# Patient Record
Sex: Female | Born: 1955
Health system: Southern US, Community
[De-identification: ages and names within clinical notes are randomized; demographics above are authoritative.]

## PROBLEM LIST (undated history)

## (undated) DIAGNOSIS — Z8489 Family history of other specified conditions: Secondary | ICD-10-CM

## (undated) DIAGNOSIS — T753XXA Motion sickness, initial encounter: Secondary | ICD-10-CM

## (undated) DIAGNOSIS — D259 Leiomyoma of uterus, unspecified: Secondary | ICD-10-CM

## (undated) DIAGNOSIS — Z780 Asymptomatic menopausal state: Secondary | ICD-10-CM

## (undated) DIAGNOSIS — M7121 Synovial cyst of popliteal space [Baker], right knee: Secondary | ICD-10-CM

## (undated) DIAGNOSIS — M47816 Spondylosis without myelopathy or radiculopathy, lumbar region: Secondary | ICD-10-CM

## (undated) HISTORY — DX: Leiomyoma of uterus, unspecified: D25.9

## (undated) HISTORY — DX: Spondylosis without myelopathy or radiculopathy, lumbar region: M47.816

## (undated) HISTORY — DX: Asymptomatic menopausal state: Z78.0

---

## 2002-05-10 HISTORY — PX: ANAL SPHINCTEROTOMY: SHX1140

## 2003-05-11 HISTORY — PX: EXCISIONAL HEMORRHOIDECTOMY: SHX1541

## 2005-02-02 ENCOUNTER — Ambulatory Visit: Payer: Self-pay | Admitting: Unknown Physician Specialty

## 2006-08-25 ENCOUNTER — Ambulatory Visit: Payer: Self-pay | Admitting: Unknown Physician Specialty

## 2006-08-29 ENCOUNTER — Ambulatory Visit: Payer: Self-pay | Admitting: Unknown Physician Specialty

## 2006-08-31 ENCOUNTER — Ambulatory Visit: Payer: Self-pay | Admitting: Unknown Physician Specialty

## 2007-08-10 ENCOUNTER — Ambulatory Visit: Payer: Self-pay | Admitting: Unknown Physician Specialty

## 2008-05-16 ENCOUNTER — Ambulatory Visit: Payer: Self-pay | Admitting: Unknown Physician Specialty

## 2008-05-23 ENCOUNTER — Ambulatory Visit: Payer: Self-pay | Admitting: Unknown Physician Specialty

## 2008-05-24 ENCOUNTER — Ambulatory Visit: Payer: Self-pay | Admitting: Unknown Physician Specialty

## 2008-12-16 ENCOUNTER — Encounter: Payer: Self-pay | Admitting: Cardiology

## 2008-12-31 ENCOUNTER — Telehealth (INDEPENDENT_AMBULATORY_CARE_PROVIDER_SITE_OTHER): Payer: Self-pay

## 2008-12-31 DIAGNOSIS — R0609 Other forms of dyspnea: Secondary | ICD-10-CM | POA: Insufficient documentation

## 2008-12-31 DIAGNOSIS — R0989 Other specified symptoms and signs involving the circulatory and respiratory systems: Secondary | ICD-10-CM

## 2008-12-31 DIAGNOSIS — R0789 Other chest pain: Secondary | ICD-10-CM | POA: Insufficient documentation

## 2009-01-01 ENCOUNTER — Ambulatory Visit: Payer: Self-pay

## 2009-01-01 ENCOUNTER — Encounter: Payer: Self-pay | Admitting: Cardiology

## 2009-01-01 ENCOUNTER — Encounter: Payer: Self-pay | Admitting: Cardiovascular Disease

## 2009-01-28 ENCOUNTER — Ambulatory Visit: Payer: Self-pay | Admitting: Cardiology

## 2009-01-28 DIAGNOSIS — I951 Orthostatic hypotension: Secondary | ICD-10-CM | POA: Insufficient documentation

## 2009-01-28 DIAGNOSIS — R0602 Shortness of breath: Secondary | ICD-10-CM | POA: Insufficient documentation

## 2009-01-28 DIAGNOSIS — R079 Chest pain, unspecified: Secondary | ICD-10-CM | POA: Insufficient documentation

## 2009-03-12 ENCOUNTER — Ambulatory Visit: Payer: Self-pay | Admitting: Unknown Physician Specialty

## 2009-03-17 LAB — HM COLONOSCOPY: HM Colonoscopy: NORMAL

## 2010-04-06 ENCOUNTER — Ambulatory Visit: Payer: Self-pay | Admitting: Unknown Physician Specialty

## 2010-06-09 NOTE — Progress Notes (Signed)
Summary: PHI  PHI   Imported By: Harlon Flor 01/28/2009 15:08:51  _____________________________________________________________________  External Attachment:    Type:   Image     Comment:   External Document

## 2010-06-09 NOTE — Progress Notes (Signed)
Summary: Office Visit-[facility]  Office Visit-[facility]   Imported By: Harlon Flor 12/19/2008 07:59:06  _____________________________________________________________________  External Attachment:    Type:   Image     Comment:   External Document  Appended Document: Office Visit-[facility] Set up Echo and Exer Myoview in [place] office then I will call her.  Reviewed Juanito Doom, MD  Appended Document: Orders Update    Clinical Lists Changes  Problems: Added new problem of CHEST PAIN, ATYPICAL (ICD-786.59) Added new problem of DYSPNEA ON EXERTION (ICD-786.09) Orders: Added new Referral order of Nuclear Stress Test (Nuc Stress Test) - Signed Added new Referral order of Echocardiogram (Echo) - Signed

## 2010-06-09 NOTE — Assessment & Plan Note (Signed)
Summary: CRS/DX:/WT:115/INS:?/DR [facility]  Nuclear Med Background Indications for Stress Test: Evaluation for Ischemia     Symptoms: Chest Pain, Palpitations, SOB    Nuclear Pre-Procedure Cardiac Risk Factors: Family History - CAD Caffeine/Decaff Intake: yes NPO After: 7:30 AM Lungs: clear IV 0.9% NS with Angio Cath: 24g     IV Site: (L)  Hand IV Started by: Burna Mortimer Deal RT-N Chest Size (in) 34     Cup Size D     Height (in): 61 Weight (lb): 115 BMI: 21.81  Nuclear Med Study 1 or 2 day study:  1 day     Stress Test Type:  Stress Reading MD:  Charlton Haws, MD     Referring MD:  T.Wall Resting Radionuclide:  Technetium 39m Tetrofosmin     Resting Radionuclide Dose:  10.2 mCi  Stress Radionuclide:  Technetium 82m Tetrofosmin     Stress Radionuclide Dose:  33 mCi   Stress Protocol Exercise Time (min):  10:30 min     Max HR:  157 bpm     Predicted Max HR: 167 bpm  Max Systolic BP: 160 mm Hg     % Max HR:  94 %     METS: 12.5 Rate Pressure Product:  16109    Stress Test Technologist:  Milana Na EMT-P     Nuclear Technologist:  Burna Mortimer Deal RT-N  Rest Procedure  Myocardial perfusion imaging was performed at rest 45 minutes following the intraveneous administration of Myoview Technetium 91m Tetrofosmin.  Stress Procedure  The patient exercised for 10:30.The patient stopped due to fatigue and denied any chest pain.  There were no significant ST-T wave changes.  Myoview was injected at peak exercise and myocardial perfusion imaging was performed after a brief delay.  QPS Raw Data Images:  Normal; no motion artifact; normal heart/lung ratio. Stress Images:  NI: Uniform and normal uptake of tracer in all myocardial segments. Rest Images:  Normal homogeneous uptake in all areas of the myocardium. Subtraction (SDS):  Normal Transient Ischemic Dilatation:  .89  (Normal <1.22)  Lung/Heart Ratio:  .3  (Normal <0.45)  Quantitative Gated Spect Images QGS EDV:  63 ml QGS ESV:   18 ml QGS EF:  72 % QGS cine images:  Normal  Findings Normal nuclear study      Overall Impression  Exercise Capacity: Good exercise capacity. BP Response: Normal blood pressure response. Clinical Symptoms: No chest pain ECG Impression: No significant ST segment change suggestive of ischemia. Overall Impression: Normal stress nuclear study. Overall Impression Comments: Normal  Appended Document: CRS/DX:/WT:115/INS:?/DR WALL/AMANDA PT AWARE OF RESULT KIMBERLY :)  Appended Document: CRS/DX:/WT:115/INS:?/DR WALL/AMANDA please call pt with good news.  Reviewed Juanito Doom, MD \

## 2010-06-09 NOTE — Assessment & Plan Note (Signed)
Summary: NP6/AMD   Visit Type:  Initial Consult  CC:  intermittent L chest warmness/ occiasional pain.  Preventive Screening-Counseling & Management  Alcohol-Tobacco     Alcohol drinks/day: 1     Alcohol type: wine     Smoking Status: never  Caffeine-Diet-Exercise     Caffeine use/day: 1 -2 cups a day     Does Patient Exercise: yes     Type of exercise: bow hunts      Drug Use:  no.    Current Medications (verified): 1)  None  Allergies (verified): 1)  ! Codeine  Past History:  Past Medical History: asthma constipation uterine fibroids degenerative arthritis of the lumbar spine  Past Surgical History: none  Family History: significant for breast cancer in her grandmother and breast cancer in maternal aunt and some heart disease in the men on her family.  Social History: Full Time Single  Tobacco Use - No.  Alcohol Use - yes Regular Exercise - yes Drug Use - no Alcohol drinks/day:  1 Does Patient Exercise:  yes Smoking Status:  never Caffeine use/day:  1 -2 cups a day Drug Use:  no  Vital Signs:  Patient profile:   55 year old female Height:      61 inches Weight:      111.50 pounds Pulse rate:   66 / minute BP sitting:   120 / 68  (left arm)  Vitals Entered By: Charlena Cross, RN, BSN (January 28, 2009 11:22 AM)   Problems:  Medical Problems Added: 1)  Dx of Dyspnea  (ICD-786.05) 2)  Dx of Orthostatic Hypotension  (ICD-458.0) 3)  Dx of Chest Pain-unspecified  (ICD-786.50)  Echocardiogram  Procedure date:  01/01/2009  Findings:      Left ventricle: The cavity size was normal. Rykker Coviello thickness was     normal. Systolic function was normal. The estimated ejection     fraction was in the range of 55% to 60%. Jeanean Hollett motion was normal;     there were no regional Aariah Godette motion abnormalities. Left ventricular     diastolic function parameters were normal.    Other Orders: EKG w/ Interpretation (93000)  Patient Instructions: 1)  Your  physician recommends that you schedule a follow-up appointment as needed.

## 2010-06-09 NOTE — Progress Notes (Signed)
Summary: [facility]   Imported By: Harlon Flor 01/28/2009 15:08:36  _____________________________________________________________________  External Attachment:    Type:   Image     Comment:   External Document

## 2010-06-09 NOTE — Progress Notes (Signed)
Summary: Nuc. Pre-procedure  Phone Note Outgoing Call Call back at [facility] Phone [phone]   Call placed by: Irean Hong, RN,  December 31, 2008 11:52 AM Summary of Call: Left message with information on Myoview Information Sheet (see scanned document for details).      Nuclear Med Background Indications for Stress Test: Evaluation for Ischemia     Symptoms: Chest Pain, SOB    Nuclear Pre-Procedure Cardiac Risk Factors: Family History - CAD

## 2010-09-22 NOTE — Assessment & Plan Note (Signed)
[facility] OFFICE NOTE   Kristi Coleman, Kristi Coleman                          MRN:          [id number]  DATE:01/28/2009                            DOB:          [birth year]    Kristi Coleman comes in today because of some atypical chest pain that she has  been having off and on for a year.  She is a 55 year old single white  female, friend of mine, who sees Dr. Lin Coleman on a annual basis.  On her  last visit, she recommended a stress test and this was arranged through  my office.   She had a nuclear stress study on January 01, 2009.  She exercised for a  total of 10-1/2 minutes, MET level achieved was 12.5, peak heart rate  was 157 which is 94% of her predicted maximum heart rate, maximum  systolic blood pressure was 160.   There were no significant ST-segment changes.  Her wall motion and  function were normal.  EF was 72% with no ischemia.  She had no chest  pain.   This discomfort is described as a ember that glows on and off under  her chest wall just above her left breast.  It does not radiate.  It is  not associated with exertion.  It can happen several times a day.  She  denies any GERD symptoms or difficulty swallowing.   She is very active hunting and farming.  She has no symptoms whatsoever.  No limitations.   PAST MEDICAL HISTORY:  Significant for asthma, history of constipation,  uterine fibroids, degenerative arthritis of the lumbar spine.   MEDICATIONS:  She is currently on no medications.   ALLERGIES:  She is allergic to CODEINE.   SOCIAL HISTORY:  Single.  She is in the [employer] and also  [employer], and land and Land.  She drinks 1-2  cups of caffeinated beverage a day.  She stays very active.  She  occasionally will have a glass of wine.  She has never smoked.   FAMILY HISTORY:  Negative for premature coronary disease.   REVIEW OF SYSTEMS:  Totally negative other than  HPI.   PHYSICAL EXAMINATION:  VITAL SIGNS:  Her blood pressure is 120/68 in the  left arm, her pulse is 66 and regular, height 61 inches, her weight is  111.5 pounds.  GENERAL:  She is in no acute distress.  HEENT:  Normal.  NECK:  Carotid upstrokes were equal bilaterally without bruits.  No JVD.  Thyroid is not enlarged.  Trachea is midline.  CHEST/LUNGS:  Clear to  auscultation and percussion.  HEART:  Nondisplaced PMI.  Normal S1 and S2.  No gallop.  ABDOMEN:  Soft, good bowel sounds.  No midline bruits.  EXTREMITIES:  No cyanosis, clubbing, or edema.  Pulses are intact.   EKG shows normal sinus rhythm with a suggestion of RSR prime in V1 and  V2 without any criteria.  Overall, normal EKG.   ASSESSMENT AND PLAN:  1. Chronic atypical chest pain, unknown etiology.  There is no  evidence of organic heart disease or coronary disease.  2. Minimal risk factors for vascular disease going forward.  These      were reviewed with the patient.  3. Good physical conditioning.   RECOMMENDATIONS:  None.  We will plan on seeing her back p.r.n.     Thomas C. Daleen Squibb, MD, [facility]  Electronically Signed    TCW/MedQ  DD: 01/28/2009  DT: 01/29/2009  Job #: [id number]   cc:   Francia Greaves, MD

## 2010-11-24 ENCOUNTER — Encounter: Payer: Self-pay | Admitting: Cardiology

## 2011-03-24 ENCOUNTER — Ambulatory Visit: Payer: Self-pay | Admitting: Internal Medicine

## 2011-04-17 LAB — HM MAMMOGRAPHY: HM Mammogram: NORMAL

## 2011-04-28 ENCOUNTER — Encounter: Payer: Self-pay | Admitting: Internal Medicine

## 2011-04-28 ENCOUNTER — Ambulatory Visit: Payer: Self-pay | Admitting: Unknown Physician Specialty

## 2011-04-28 ENCOUNTER — Ambulatory Visit (INDEPENDENT_AMBULATORY_CARE_PROVIDER_SITE_OTHER): Payer: 59 | Admitting: Internal Medicine

## 2011-04-28 VITALS — BP 120/70 | HR 81 | Temp 98.1°F | Ht 61.25 in | Wt 112.8 lb

## 2011-04-28 DIAGNOSIS — R432 Parageusia: Secondary | ICD-10-CM

## 2011-04-28 DIAGNOSIS — Z780 Asymptomatic menopausal state: Secondary | ICD-10-CM | POA: Insufficient documentation

## 2011-04-28 DIAGNOSIS — R439 Unspecified disturbances of smell and taste: Secondary | ICD-10-CM

## 2011-04-28 NOTE — Patient Instructions (Signed)
Try OTC Prilosec ( omeprazole)  20 mg daily for a minimum of 7 days  the next time you get  The metallic taste, to see if the cause is reflux.Marland Kitchen

## 2011-05-02 ENCOUNTER — Encounter: Payer: Self-pay | Admitting: Internal Medicine

## 2011-05-02 DIAGNOSIS — R432 Parageusia: Secondary | ICD-10-CM | POA: Insufficient documentation

## 2011-05-02 MED ORDER — OMEPRAZOLE 20 MG PO CPDR: 20.0000 mg | DELAYED_RELEASE_CAPSULE | Freq: Every day | ORAL | Status: DC

## 2011-05-02 NOTE — Progress Notes (Signed)
  Subjective:    Patient ID: Kristi Coleman, female    DOB: [birth year], 55 y.o.   MRN: [id number]  HPI  Kristi Coleman is a 55 yr odl postmenopausal female who presents to establish primary care, transferring from Kristi Coleman who is leaving the area. She has a chief complaint of a metallic taste in her mouth.  She states that she has the symptom several times per day for the last 3 months or longer.  She denies any recent upper respiratory infection, and has no history of allergic rhinitis.  She has occasional reflux but not on a daily basis.  She has no symptoms of altered taste, no headaches, sinus pain .  She deos not smoke or drink alcohol to excess,  No use of illicit drugs.  No history of iron deficiency or PICA.  Past Medical History  Diagnosis Date  . Asthma   . Constipation   . Uterine fibroid   . Degenerative arthritis of lumbar spine   . Menopause      No current outpatient prescriptions on file prior to visit.   Review of Systems  Constitutional: Negative for fever, chills and unexpected weight change.  HENT: Negative for hearing loss, ear pain, nosebleeds, congestion, sore throat, facial swelling, rhinorrhea, sneezing, mouth sores, trouble swallowing, neck pain, neck stiffness, voice change, postnasal drip, sinus pressure, tinnitus and ear discharge.   Eyes: Negative for pain, discharge, redness and visual disturbance.  Respiratory: Negative for cough, chest tightness, shortness of breath, wheezing and stridor.   Cardiovascular: Negative for chest pain, palpitations and leg swelling.  Musculoskeletal: Negative for myalgias and arthralgias.  Skin: Negative for color change and rash.  Neurological: Negative for dizziness, weakness, light-headedness and headaches.  Hematological: Negative for adenopathy.           Objective:   Physical Exam  Constitutional: She is oriented to person, place, and time. She appears well-developed and well-nourished.  HENT:  Mouth/Throat:  Oropharynx is clear and moist.  Eyes: EOM are normal. Pupils are equal, round, and reactive to light. No scleral icterus.  Neck: Normal range of motion. Neck supple. No JVD present. No thyromegaly present.  Cardiovascular: Normal rate, regular rhythm, normal heart sounds and intact distal pulses.   Pulmonary/Chest: Effort normal and breath sounds normal.  Abdominal: Soft. Bowel sounds are normal. She exhibits no mass. There is no tenderness.  Musculoskeletal: Normal range of motion. She exhibits no edema.  Lymphadenopathy:    She has no cervical adenopathy.  Neurological: She is alert and oriented to person, place, and time.  Skin: Skin is warm and dry.  Psychiatric: She has a normal mood and affect.          Assessment & Plan:

## 2011-05-02 NOTE — Assessment & Plan Note (Signed)
She has no history of exposure to the drugs commonly associated with causing metallic taste in mouth.  Will treat her empirically for reflux and allergic rhinitis while waiting for recent labs from [facility] office. Will need to rule out sinus disease with ENT evlauation and check serologies for Sjogren's, liver and kidney dysfunction if symptoms doe not resolved with empiric treatment of above.

## 2011-07-08 ENCOUNTER — Encounter: Payer: Self-pay | Admitting: Internal Medicine

## 2012-01-16 LAB — HM PAP SMEAR: HM Pap smear: NORMAL

## 2012-01-16 LAB — HM MAMMOGRAPHY: HM Mammogram: NORMAL

## 2012-02-16 DIAGNOSIS — M7710 Lateral epicondylitis, unspecified elbow: Secondary | ICD-10-CM | POA: Insufficient documentation

## 2012-03-15 ENCOUNTER — Telehealth: Payer: Self-pay | Admitting: Internal Medicine

## 2012-03-15 NOTE — Telephone Encounter (Signed)
Patient calling, has left arm pain to the elbow.  Has tingling, numbness and a "hot" sensation that awakens her at night.  During the day will have a tingling sensation intermittently during the day.   Has been takin Aleve with no relief of the sx.   Triaged per Arm Non-Injury.  Needs to be seen in  24 hours.  No appts available for today.  Scheduled for 1115 on Thursday with Dr. Darrick Huntsman.

## 2012-03-16 ENCOUNTER — Encounter: Payer: Self-pay | Admitting: Internal Medicine

## 2012-03-16 ENCOUNTER — Ambulatory Visit (INDEPENDENT_AMBULATORY_CARE_PROVIDER_SITE_OTHER): Payer: 59 | Admitting: Internal Medicine

## 2012-03-16 VITALS — BP 118/70 | HR 64 | Temp 98.2°F | Ht 61.5 in | Wt 112.2 lb

## 2012-03-16 DIAGNOSIS — G589 Mononeuropathy, unspecified: Secondary | ICD-10-CM

## 2012-03-16 DIAGNOSIS — G629 Polyneuropathy, unspecified: Secondary | ICD-10-CM

## 2012-03-16 MED ORDER — MELOXICAM 15 MG PO TABS: 15.0000 mg | ORAL_TABLET | Freq: Every day | ORAL | Status: DC

## 2012-03-16 NOTE — Progress Notes (Signed)
Patient ID: Kristi Coleman, female   DOB: November 20, 1955, 56 y.o.   MRN: [id number]  Patient Active Problem List  Diagnosis  . Dysgeusia  . Neuropathy    Subjective:  CC:   Chief Complaint  Patient presents with  . Arm Pain    HPI:   Kristi Coleman a 56 y.o. female who presents Followup on acute and chronic issues. She said Dr. Mahlon Gammon last week for her annual Pap smear. She was diagnosed with low vitamin D and a prescription was given to her. 2) a worsening numbness, left hand. She has a 6 to 8 month history of left forearm having occasional numbness. 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 She noted some point tenderness on the lateral aspect of her left elbow about a month ago and has stopped all physical labor involving use of the left arm. She thinks that the pain was aggravated by wasting 50 pound bags of feed above her waist level. However despite modification of her physical activity she has developed numbness in the left forearm and hand which has become persistent and daily. Numbness more pronounced in the morning.  She was evaluated casually by a visit physician's assistant at [facility] recently who obtained an x-ray of the elbow which showed no signs of arthritis. She has been taking Aleve daily for a week with no consideration will improvement in the pain. She denies any loss of strength or fine motor skills. No history of neck pain or shoulder pain. A   Past Medical History  Diagnosis Date  . Asthma   . Constipation   . Uterine fibroid   . Degenerative arthritis of lumbar spine   . Menopause     Past Surgical History  Procedure Date  . Excisional hemorrhoidectomy 2005    [facility]         The following portions of the patient's history were reviewed and updated as appropriate: Allergies, current medications, and problem list.    Review of Systems:   12 Pt  review of systems was negative except those addressed in the  HPI,     History   Social History  . Marital Status: Married    Spouse Name: N/A    Number of Children: N/A  . Years of Education: N/A   Occupational History  . Full time    Social History Main Topics  . Smoking status: Never Smoker   . Smokeless tobacco: Not on file     Comment: Tobacco use-no  . Alcohol Use: 2.5 oz/week    5 drink(s) per week  . Drug Use: No  . Sexually Active: Not Currently   Other Topics Concern  . Not on file   Social History Narrative   Single.Pt gets regular exercise.    Objective:  BP 118/70  Pulse 64  Temp 98.2 F (36.8 C) (Oral)  Ht 5' 1.5" (1.562 m)  Wt 112 lb 4 oz (50.916 kg)  BMI 20.87 kg/m2  SpO2 98%  General appearance: alert, cooperative and appears stated age  Neck: no adenopathy, no carotid bruit, supple, symmetrical, trachea midline and thyroid not enlarged, symmetric, no tenderness/mass/nodules Back: symmetric, no curvature. ROM normal. No CVA tenderness. Lungs: clear to auscultation bilaterally Heart: regular rate and rhythm, S1, S2 normal, no murmur, click, rub or gallop Abdomen: soft, non-tender; bowel sounds normal; no masses,  no organomegaly Pulses: 2+ and symmetric including left radial wrist  Skin: Skin color, texture, turgor normal.  No rashes or lesions Lymph nodes: Cervical, supraclavicular, and axillary nodes normal. Neuro: Left arm DTRs normal.,  Strength normal at elbow and wrist. No loss of thenar muscle tone.  Positive Phalen's sign.   Assessment and Plan:  Neuropathy A symmetric, peripheral. Unclear whether her distribution of symptoms points to median or ulnar neuropathy or combined neuropathy. Recommended trial of meloxicam continued abstinence from physical activities and EMG nerve conduction studies.   Updated Medication List Outpatient Encounter Prescriptions as of 03/16/2012  Medication Sig Dispense Refill  . meloxicam (MOBIC) 15 MG tablet Take 1 tablet (15 mg total) by mouth daily.  30 tablet  2   . omeprazole (PRILOSEC) 20 MG capsule Take 1 capsule (20 mg total) by mouth daily.  30 capsule  3     Orders Placed This Encounter  Procedures  . Heavy metals screen, urine  . NCV with EMG(electromyography)    No Follow-up on file.

## 2012-03-16 NOTE — Patient Instructions (Signed)
Take the meloxicam once daily with breakfast.  Ok to add 500 mg tylenol every 6 hours if needed ,  Do not take alleve or ibuprofen with meloxicam  EMG /nerve conduction studies of forearm to determine if this is all median (carpal tunnel) or ulnar nerve driven.    24 hr urine collection for heavy metals.

## 2012-03-17 ENCOUNTER — Encounter: Payer: Self-pay | Admitting: Internal Medicine

## 2012-03-17 DIAGNOSIS — G629 Polyneuropathy, unspecified: Secondary | ICD-10-CM | POA: Insufficient documentation

## 2012-03-17 NOTE — Assessment & Plan Note (Signed)
A symmetric, peripheral. Unclear whether her distribution of symptoms points to median or ulnar neuropathy or combined neuropathy. Recommended trial of meloxicam continued abstinence from physical activities and EMG nerve conduction studies.

## 2012-05-22 ENCOUNTER — Other Ambulatory Visit (INDEPENDENT_AMBULATORY_CARE_PROVIDER_SITE_OTHER): Payer: 59

## 2012-05-22 DIAGNOSIS — G589 Mononeuropathy, unspecified: Secondary | ICD-10-CM

## 2012-05-30 LAB — HEAVY METALS SCREEN, URINE
Arsenic, 24H Ur: 12 mcg/L (ref <81)
Lead, Urine (24 Hr): <1 mcg/L (ref <80)
Mercury 24 Hr Urine: <2 mcg/L (ref <21)

## 2012-05-31 ENCOUNTER — Ambulatory Visit: Payer: Self-pay | Admitting: Internal Medicine

## 2012-06-12 ENCOUNTER — Encounter: Payer: Self-pay | Admitting: Internal Medicine

## 2013-06-01 ENCOUNTER — Ambulatory Visit: Payer: Self-pay | Admitting: Internal Medicine

## 2013-06-01 LAB — HM MAMMOGRAPHY: HM Mammogram: NEGATIVE

## 2013-06-05 ENCOUNTER — Encounter: Payer: Self-pay | Admitting: Internal Medicine

## 2013-10-17 ENCOUNTER — Ambulatory Visit (INDEPENDENT_AMBULATORY_CARE_PROVIDER_SITE_OTHER): Payer: BC Managed Care – PPO | Admitting: Internal Medicine

## 2013-10-17 ENCOUNTER — Encounter: Payer: Self-pay | Admitting: *Deleted

## 2013-10-17 ENCOUNTER — Encounter: Payer: Self-pay | Admitting: Internal Medicine

## 2013-10-17 VITALS — BP 106/60 | HR 58 | Temp 97.7°F | Resp 14 | Ht 61.0 in | Wt 114.2 lb

## 2013-10-17 DIAGNOSIS — Z800 Family history of malignant neoplasm of digestive organs: Secondary | ICD-10-CM

## 2013-10-17 DIAGNOSIS — Z13220 Encounter for screening for lipoid disorders: Secondary | ICD-10-CM

## 2013-10-17 DIAGNOSIS — R632 Polyphagia: Secondary | ICD-10-CM

## 2013-10-17 DIAGNOSIS — Z8719 Personal history of other diseases of the digestive system: Secondary | ICD-10-CM

## 2013-10-17 NOTE — Progress Notes (Signed)
Pre-visit discussion using our clinic review tool. No additional management support is needed unless otherwise documented below in the visit note.  

## 2013-10-17 NOTE — Patient Instructions (Signed)
Your recurrent aphthous ulcers may be a signs of a food allergy or a vitamin deficiency  When you return for fasting labs I will investigate this possibility

## 2013-10-18 ENCOUNTER — Other Ambulatory Visit (INDEPENDENT_AMBULATORY_CARE_PROVIDER_SITE_OTHER): Payer: BC Managed Care – PPO

## 2013-10-18 DIAGNOSIS — Z13220 Encounter for screening for lipoid disorders: Secondary | ICD-10-CM

## 2013-10-18 DIAGNOSIS — K120 Recurrent oral aphthae: Secondary | ICD-10-CM | POA: Insufficient documentation

## 2013-10-18 DIAGNOSIS — R632 Polyphagia: Secondary | ICD-10-CM

## 2013-10-18 DIAGNOSIS — Z8719 Personal history of other diseases of the digestive system: Secondary | ICD-10-CM

## 2013-10-18 LAB — CBC WITH DIFFERENTIAL/PLATELET
Basophils Absolute: 0 10*3/uL (ref 0.0–0.1)
Basophils Relative: 0.6 % (ref 0.0–3.0)
Eosinophils Absolute: 0.1 10*3/uL (ref 0.0–0.7)
Eosinophils Relative: 2.5 % (ref 0.0–5.0)
HCT: 35.3 % — ABNORMAL LOW (ref 36.0–46.0)
Hemoglobin: 12 g/dL (ref 12.0–15.0)
Lymphocytes Relative: 32.2 % (ref 12.0–46.0)
Lymphs Abs: 1.6 10*3/uL (ref 0.7–4.0)
MCHC: 34 g/dL (ref 30.0–36.0)
MCV: 91.8 fl (ref 78.0–100.0)
Monocytes Absolute: 0.4 10*3/uL (ref 0.1–1.0)
Monocytes Relative: 8.2 % (ref 3.0–12.0)
Neutro Abs: 2.8 10*3/uL (ref 1.4–7.7)
Neutrophils Relative %: 56.5 % (ref 43.0–77.0)
Platelets: 179 10*3/uL (ref 150.0–400.0)
RBC: 3.85 Mil/uL — ABNORMAL LOW (ref 3.87–5.11)
RDW: 13.3 % (ref 11.5–15.5)
WBC: 4.9 10*3/uL (ref 4.0–10.5)

## 2013-10-18 LAB — SEDIMENTATION RATE: Sed Rate: 19 mm/hr (ref 0–22)

## 2013-10-18 LAB — COMPREHENSIVE METABOLIC PANEL
ALT: 18 U/L (ref 0–35)
AST: 19 U/L (ref 0–37)
Albumin: 4.1 g/dL (ref 3.5–5.2)
Alkaline Phosphatase: 55 U/L (ref 39–117)
BUN: 20 mg/dL (ref 6–23)
CO2: 30 mEq/L (ref 19–32)
Calcium: 9.6 mg/dL (ref 8.4–10.5)
Chloride: 102 mEq/L (ref 96–112)
Creatinine, Ser: 0.6 mg/dL (ref 0.4–1.2)
GFR: 113.46 mL/min (ref >60.00)
Glucose, Bld: 95 mg/dL (ref 70–99)
Potassium: 4.6 mEq/L (ref 3.5–5.1)
Sodium: 138 mEq/L (ref 135–145)
Total Bilirubin: 0.6 mg/dL (ref 0.2–1.2)
Total Protein: 6.6 g/dL (ref 6.0–8.3)

## 2013-10-18 LAB — LIPID PANEL
Cholesterol: 205 mg/dL — ABNORMAL HIGH (ref 0–200)
HDL: 86.9 mg/dL (ref >39.00)
LDL Cholesterol: 109 mg/dL — ABNORMAL HIGH (ref 0–99)
NonHDL: 118.1
Total CHOL/HDL Ratio: 2
Triglycerides: 44 mg/dL (ref 0.0–149.0)
VLDL: 8.8 mg/dL (ref 0.0–40.0)

## 2013-10-18 LAB — VITAMIN B12: Vitamin B-12: 513 pg/mL (ref 211–911)

## 2013-10-18 LAB — T4, FREE: Free T4: 0.75 ng/dL (ref 0.60–1.60)

## 2013-10-18 LAB — TSH: TSH: 2.41 u[IU]/mL (ref 0.35–4.50)

## 2013-10-18 MED ORDER — TRIAMCINOLONE ACETONIDE 0.1 % MT PSTE: 1.0000 "application " | PASTE | Freq: Two times a day (BID) | OROMUCOSAL | Status: DC

## 2013-10-18 NOTE — Assessment & Plan Note (Addendum)
She has no signs or symptoms of celiac disease  Or SLE but will  check serologies for nutritional deficiencies. Referral to GI for colonoscopy and endoscopy to rule out celiac disease

## 2013-10-18 NOTE — Progress Notes (Signed)
Patient ID: Kristi Coleman, female   DOB: [birth year], 58 y.o.   MRN: [id number]  Patient Active Problem List   Diagnosis Date Noted  . Recurrent aphthous stomatitis 10/18/2013    Subjective:  CC:   Chief Complaint  Patient presents with  . Headache    X 2  weeks non stop have now stopped. mouth ulcers weekly.    HPI:   Kristi Coleman is a 58 y.o. female who presents for Recurrent aphthous ulcers.  She reports painful mouth ulcers occurring nearly weekly.  Has tried avoiding certain foods with some improvement. o history fo skin ulcers, arthritis,  Or Genital ulcers. Prior HSV serologies were negative. Not sexually active.  Has had prior evaluations by ENT including allergy testing. Used silver nitrate in the past ,  By prior PCP, but procedyure was painful .Also has occasional swelling of upper eyelids noted in there morning ,    Past Medical History  Diagnosis Date  . Asthma   . Constipation   . Uterine fibroid   . Degenerative arthritis of lumbar spine   . Menopause     Past Surgical History  Procedure Laterality Date  . Excisional hemorrhoidectomy  2005    [facility]  . Anal sphincterotomy  2004    [facility]       The following portions of the patient's history were reviewed and updated as appropriate: Allergies, current medications, and problem list.    Review of Systems:   Patient denies headache, fevers, malaise, unintentional weight loss, skin rash, eye pain, sinus congestion and sinus pain, sore throat, dysphagia,  hemoptysis , cough, dyspnea, wheezing, chest pain, palpitations, orthopnea, edema, abdominal pain, nausea, melena, diarrhea, constipation, flank pain, dysuria, hematuria, urinary  Frequency, nocturia, numbness, tingling, seizures,  Focal weakness, Loss of consciousness,  Tremor, insomnia, depression, anxiety, and suicidal ideation.     History   Social History  . Marital Status: Married    Spouse Name: N/A    Number of Children: N/A  . Years of  Education: N/A   Occupational History  . Full time    Social History Main Topics  . Smoking status: Never Smoker   . Smokeless tobacco: Not on file     Comment: Tobacco use-no  . Alcohol Use: 2.5 oz/week    5 drink(s) per week  . Drug Use: No  . Sexual Activity: Not Currently   Other Topics Concern  . Not on file   Social History Narrative   Single.   Pt gets regular exercise.    Objective:  Filed Vitals:   10/17/13 1117  BP: 106/60  Pulse: 58  Temp: 97.7 F (36.5 C)  Resp: 14     General appearance: alert, cooperative and appears stated age Ears: normal TM's and external ear canals both ears Throat: lips, mucosa, and tongue normal; teeth and gums normal Neck: no adenopathy, no carotid bruit, supple, symmetrical, trachea midline and thyroid not enlarged, symmetric, no tenderness/mass/nodules Back: symmetric, no curvature. ROM normal. No CVA tenderness. Lungs: clear to auscultation bilaterally Heart: regular rate and rhythm, S1, S2 normal, no murmur, click, rub or gallop Abdomen: soft, non-tender; bowel sounds normal; no masses,  no organomegaly Pulses: 2+ and symmetric Skin: Skin color, texture, turgor normal. No rashes or lesions Lymph nodes: Cervical, supraclavicular, and axillary nodes normal.  Assessment and Plan:  Recurrent aphthous stomatitis She has no signs or symptoms of celiac disease  Or SLE but will  check serologies for nutritional deficiencies. Referral to GI for colonoscopy  and endoscopy to rule out celiac disease   Updated Medication List Outpatient Encounter Prescriptions as of 10/17/2013  Medication Sig  . meloxicam (MOBIC) 15 MG tablet Take 1 tablet (15 mg total) by mouth daily.  Marland Kitchen omeprazole (PRILOSEC) 20 MG capsule Take 1 capsule (20 mg total) by mouth daily.     Orders Placed This Encounter  Procedures  . CBC with Differential  . Comprehensive metabolic panel  . TSH  . Lipid panel  . T4, free  . Vitamin B1  . Vitamin B12  .  Zinc  . Sedimentation rate  . Ambulatory referral to Gastroenterology    No Follow-up on file.

## 2013-10-22 LAB — VITAMIN B1: Vitamin B1 (Thiamine): 15 nmol/L (ref 8–30)

## 2013-10-23 ENCOUNTER — Encounter: Payer: Self-pay | Admitting: Internal Medicine

## 2013-10-23 ENCOUNTER — Encounter: Payer: Self-pay | Admitting: *Deleted

## 2013-10-23 LAB — ZINC: Zinc: 75 ug/dL (ref 60–130)

## 2013-12-27 ENCOUNTER — Ambulatory Visit: Payer: Self-pay | Admitting: Unknown Physician Specialty

## 2014-01-21 ENCOUNTER — Encounter: Payer: Self-pay | Admitting: Internal Medicine

## 2014-07-03 ENCOUNTER — Ambulatory Visit: Payer: Self-pay | Admitting: Internal Medicine

## 2014-07-03 LAB — HM MAMMOGRAPHY: HM Mammogram: NEGATIVE

## 2014-07-05 ENCOUNTER — Encounter: Payer: Self-pay | Admitting: Internal Medicine

## 2015-02-26 ENCOUNTER — Encounter: Payer: Self-pay | Admitting: Internal Medicine

## 2015-02-26 ENCOUNTER — Telehealth: Payer: Self-pay

## 2015-02-26 ENCOUNTER — Ambulatory Visit (INDEPENDENT_AMBULATORY_CARE_PROVIDER_SITE_OTHER): Payer: BLUE CROSS/BLUE SHIELD | Admitting: Internal Medicine

## 2015-02-26 VITALS — BP 106/70 | HR 70 | Temp 98.3°F | Resp 12 | Ht 61.0 in | Wt 109.0 lb

## 2015-02-26 DIAGNOSIS — Z23 Encounter for immunization: Secondary | ICD-10-CM

## 2015-02-26 NOTE — Telephone Encounter (Signed)
Patient states that she hammered a rusty nail into her hand by accident. Patient is wondering if she needs to come in and have another tetanus vaccine? Patient states hat she believes she is due for another vaccine, but she didn't know if she still needed to come in and get a Tetanus if she had already been exposed.

## 2015-02-26 NOTE — Telephone Encounter (Signed)
Please add patient at  11.30

## 2015-03-01 NOTE — Progress Notes (Signed)
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

## 2015-07-08 ENCOUNTER — Ambulatory Visit (INDEPENDENT_AMBULATORY_CARE_PROVIDER_SITE_OTHER): Payer: BLUE CROSS/BLUE SHIELD | Admitting: Internal Medicine

## 2015-07-08 ENCOUNTER — Other Ambulatory Visit: Payer: Self-pay | Admitting: Internal Medicine

## 2015-07-08 ENCOUNTER — Telehealth: Payer: Self-pay | Admitting: Internal Medicine

## 2015-07-08 ENCOUNTER — Encounter: Payer: Self-pay | Admitting: Internal Medicine

## 2015-07-08 ENCOUNTER — Ambulatory Visit (INDEPENDENT_AMBULATORY_CARE_PROVIDER_SITE_OTHER)
Admission: RE | Admit: 2015-07-08 | Discharge: 2015-07-08 | Disposition: A | Discharge status: Home / Self Care | Payer: BLUE CROSS/BLUE SHIELD | Source: Ambulatory Visit | Attending: Internal Medicine | Admitting: Internal Medicine

## 2015-07-08 VITALS — BP 124/70 | HR 60 | Temp 97.9°F | Resp 12 | Ht 61.0 in | Wt 109.2 lb

## 2015-07-08 DIAGNOSIS — R079 Chest pain, unspecified: Secondary | ICD-10-CM | POA: Diagnosis not present

## 2015-07-08 DIAGNOSIS — R0789 Other chest pain: Secondary | ICD-10-CM

## 2015-07-08 LAB — COMPREHENSIVE METABOLIC PANEL
ALT: 23 U/L (ref 0–35)
AST: 22 U/L (ref 0–37)
Albumin: 4.6 g/dL (ref 3.5–5.2)
Alkaline Phosphatase: 45 U/L (ref 39–117)
BUN: 24 mg/dL — ABNORMAL HIGH (ref 6–23)
CO2: 28 mEq/L (ref 19–32)
Calcium: 9.6 mg/dL (ref 8.4–10.5)
Chloride: 102 mEq/L (ref 96–112)
Creatinine, Ser: 0.77 mg/dL (ref 0.40–1.20)
GFR: 81.33 mL/min (ref >60.00)
Glucose, Bld: 96 mg/dL (ref 70–99)
Potassium: 4.2 mEq/L (ref 3.5–5.1)
Sodium: 137 mEq/L (ref 135–145)
Total Bilirubin: 0.3 mg/dL (ref 0.2–1.2)
Total Protein: 7.3 g/dL (ref 6.0–8.3)

## 2015-07-08 LAB — LIPID PANEL
Cholesterol: 212 mg/dL — ABNORMAL HIGH (ref 0–200)
HDL: 78.8 mg/dL (ref >39.00)
LDL Cholesterol: 121 mg/dL — ABNORMAL HIGH (ref 0–99)
NonHDL: 133.37
Total CHOL/HDL Ratio: 3
Triglycerides: 60 mg/dL (ref 0.0–149.0)
VLDL: 12 mg/dL (ref 0.0–40.0)

## 2015-07-08 LAB — CBC WITH DIFFERENTIAL/PLATELET
Basophils Absolute: 0 10*3/uL (ref 0.0–0.1)
Basophils Relative: 0.6 % (ref 0.0–3.0)
Eosinophils Absolute: 0.2 10*3/uL (ref 0.0–0.7)
Eosinophils Relative: 2.2 % (ref 0.0–5.0)
HCT: 35.8 % — ABNORMAL LOW (ref 36.0–46.0)
Hemoglobin: 12.3 g/dL (ref 12.0–15.0)
Lymphocytes Relative: 31.4 % (ref 12.0–46.0)
Lymphs Abs: 2.1 10*3/uL (ref 0.7–4.0)
MCHC: 34.4 g/dL (ref 30.0–36.0)
MCV: 90.8 fl (ref 78.0–100.0)
Monocytes Absolute: 0.5 10*3/uL (ref 0.1–1.0)
Monocytes Relative: 6.9 % (ref 3.0–12.0)
Neutro Abs: 4 10*3/uL (ref 1.4–7.7)
Neutrophils Relative %: 58.9 % (ref 43.0–77.0)
Platelets: 174 10*3/uL (ref 150.0–400.0)
RBC: 3.95 Mil/uL (ref 3.87–5.11)
RDW: 13.2 % (ref 11.5–15.5)
WBC: 6.7 10*3/uL (ref 4.0–10.5)

## 2015-07-08 LAB — TROPONIN I: TNIDX: 0.01 ug/l (ref 0.00–0.06)

## 2015-07-08 LAB — CK TOTAL AND CKMB (NOT AT ARMC)
CK, MB: 2 ng/mL (ref 0.0–5.0)
Relative Index: 1.7 (ref 0.0–4.0)
Total CK: 118 U/L (ref 7–177)

## 2015-07-08 NOTE — Telephone Encounter (Signed)
Noted  

## 2015-07-08 NOTE — Telephone Encounter (Signed)
Patient Name: Kristi Coleman DOB: [birth year] Initial Comment caller states she was having a dental implant done today - was having chest pain and left arm numbness - is still at the dental office Nurse Assessment Nurse: Vallery Sa, RN, [place] Date/Time ([place] Time): 07/08/2015 10:53:59 AM Confirm and document reason for call. If symptomatic, describe symptoms. You must click the next button to save text entered. ---Caller states she developed chest pain with shoulder numbness while have a dental implant placed today (rated as a 5). Call disconnected. Called back and left message to call back or call 911 as needed. Has the patient traveled out of the country within the last 30 days? ---Not Applicable Does the patient have any new or worsening symptoms? ---Yes Will a triage be completed? ---No Select reason for no triage. ---Other Nurse: Vallery Sa, RN, Cathy Date/Time (Eastern Time): 07/08/2015 11:46:59 AM Confirm and document reason for call. If symptomatic, describe symptoms. You must click the next button to save text entered. ---Called back and reached [name]. She developed chest pain and numbness in her left arm during dental work about 30 minutes ago. No breathing difficulty or chest pain at this time. Has the patient traveled out of the country within the last 30 days? ---No Does the patient have any new or worsening symptoms? ---Yes Will a triage be completed? ---Yes Related visit to physician within the last 2 weeks? ---No Does the PT have any chronic conditions? (i.e. diabetes, asthma, etc.) ---Yes List chronic conditions. ---Dental work Is this a behavioral health or substance abuse call? ---No Guidelines Guideline Title Affirmed Question Affirmed Notes Chest Pain [1] Chest pain lasts > 5 minutes AND [2] age > 68 Final Disposition User Call EMS 911 Now [place], [place], [employer] declined the Call 911 disposition. Reinforced the Call 911 disposition. She states  that she will not call 911 and is waiting to hear back from Dr. Derrel Nip. Called the office backline and notified PLEASE NOTE: All timestamps contained within this report are represented as [place] Standard Time. CONFIDENTIALTY NOTICE: This fax transmission is intended only for the addressee. It contains information that is legally privileged, confidential or otherwise protected from use or disclosure. If you are not the intended recipient, you are strictly prohibited from reviewing, disclosing, copying using or disseminating any of this information or taking any action in reliance on or regarding this information. If you have received this fax in error, please notify us immediately by telephone so that we can arrange for its return to [place]. Phone: [phone], Toll-Free: [phone], Fax: [phone] Page: 2 of 2 Call Id: [id number] Comments Kristi Coleman. Kristi Coleman states Dr. Derrel Nip is not in the office and Dr. Gilford Rile is covering. Dr. Gilford Rile states [name] should call 911. Caller hung up while on hold. Called her back and Kristi Coleman states that Dr. Derrel Nip is going to meet her in the office. Referrals GO TO FACILITY REFUSED Disagree/Comply: Disagree Disagree/Comply Reason: Disagree with instructions

## 2015-07-08 NOTE — Telephone Encounter (Signed)
Pt being seen by Dr. Derrel Nip at 12:15

## 2015-07-08 NOTE — Telephone Encounter (Signed)
[facility]  Patient Name: Kristi Coleman  DOB: 1956/04/14    Initial Comment [name] with [place] at [phone]. Stat lab   Nurse Assessment  Nurse: Wisdom, RN, Susie Date/Time (Eastern Time): 07/08/2015 6:38:09 PM  Is there an on-call provider listed? ---Yes  Please list name of person reporting value (Lab Employee) and a contact number. ---[facility] - [name] [phone]  Please document the following items: Lab name Lab value (read back to lab to verify) Reference range for lab value Date and time blood was drawn Collect time of birth for bilirubin results ---CK Total 118 (7 - 177) CK MB 2.0 (0 - 5.0) Collected: 07/07/2015 at 12: 42 PM  Please collect the patient contact information from the lab. (name, phone number and address) ---Patient phone number: [phone]     Guidelines    Guideline Title Affirmed Question Affirmed Notes       Final Disposition User

## 2015-07-08 NOTE — Telephone Encounter (Signed)
FYI: this is the pt that hung up

## 2015-07-08 NOTE — Progress Notes (Signed)
Subjective:  Patient ID: Kristi Coleman, female    DOB: 11/06/1955  Age: 60 y.o. MRN: [id number]  CC: The primary encounter diagnosis was Chest pain, unspecified chest pain type. A diagnosis of Other chest pain was also pertinent to this visit. <2%  HPI Kristi Coleman presents for evaluation of left sided chest pain which occurred this morning during oral surgery. patient was having a dental implant and received a localized  Lidocaine/epi injection.  Developed a pain which she describes as "feeling like a bubble was pushing against my heart" accompanied by left hand feeling numb. However, the automatic BP cuff was inflated on her left bicep . She recalls that there was no report of tachycardia, but her blood pressure was elevated (for her) to  130/85 at surgeon's office.   Patient is physically very active and does not recall any prior episodes  Of exertional chest pain but states that the pain has been present on and off at rest and is present to a much  lesser degree currently.  She denies diaphoresis , dyspnea, jaw pain, and nausea.   Outpatient Prescriptions Prior to Visit  Medication Sig Dispense Refill  . Multiple Vitamins-Minerals (MULTIVITAMIN WITH MINERALS) tablet Take 1 tablet by mouth daily.     No facility-administered medications prior to visit.    Review of Systems;  Patient denies headache, fevers, malaise, unintentional weight loss, skin rash, eye pain, sinus congestion and sinus pain, sore throat, dysphagia,  hemoptysis , cough, dyspnea, wheezing, chest pain, palpitations, orthopnea, edema, abdominal pain, nausea, melena, diarrhea, constipation, flank pain, dysuria, hematuria, urinary  Frequency, nocturia, numbness, tingling, seizures,  Focal weakness, Loss of consciousness,  Tremor, insomnia, depression, anxiety, and suicidal ideation.      Objective:  BP 124/70 mmHg  Pulse 60  Temp(Src) 97.9 F (36.6 C) (Oral)  Resp 12  Ht 5\' 1"  (1.549 m)  Wt 109 lb 4 oz (49.555 kg)   BMI 20.65 kg/m2  SpO2 98%  BP Readings from Last 3 Encounters:  07/08/15 124/70  02/26/15 106/70  10/17/13 106/60    Wt Readings from Last 3 Encounters:  07/08/15 109 lb 4 oz (49.555 kg)  02/26/15 109 lb (49.442 kg)  10/17/13 114 lb 4 oz (51.823 kg)    General appearance: alert, cooperative and appears stated age Ears: normal TM's and external ear canals both ears Throat: lips, mucosa, and tongue normal; teeth and gums normal Neck: no adenopathy, no carotid bruit, supple, symmetrical, trachea midline and thyroid not enlarged, symmetric, no tenderness/mass/nodules Back: symmetric, no curvature. ROM normal. No CVA tenderness. Lungs: clear to auscultation bilaterally Heart: regular rate and rhythm, S1, S2 normal, no murmur, click, rub or gallop Abdomen: soft, non-tender; bowel sounds normal; no masses,  no organomegaly Pulses: 2+ and symmetric Skin: Skin color, texture, turgor normal. No rashes or lesions Lymph nodes: Cervical, supraclavicular, and axillary nodes normal.  No results found for: HGBA1C  Lab Results  Component Value Date   CREATININE 0.77 07/08/2015   CREATININE 0.6 10/18/2013    Lab Results  Component Value Date   WBC 6.7 07/08/2015   HGB 12.3 07/08/2015   HCT 35.8* 07/08/2015   PLT 174.0 07/08/2015   GLUCOSE 96 07/08/2015   CHOL 212* 07/08/2015   TRIG 60.0 07/08/2015   HDL 78.80 07/08/2015   LDLCALC 121* 07/08/2015   ALT 23 07/08/2015   AST 22 07/08/2015   NA 137 07/08/2015   K 4.2 07/08/2015   CL 102 07/08/2015   CREATININE 0.77  07/08/2015   BUN 24* 07/08/2015   CO2 28 07/08/2015   TSH 2.41 10/18/2013      Assessment & Plan:   Problem List Items Addressed This Visit    Pain in the chest - Primary    Her pain is atypical and she has no identifiable CRFS.  Cardiac enzymes, EKG and chest x ray were done today and no ischemic changes noted.  Reassurance provided. .Nonurgent referral to Dr Rockey Situ for evaluation  Has been made. Non fasting labs  done today are normal:  Lab Results  Component Value Date   CHOL 212* 07/08/2015   HDL 78.80 07/08/2015   LDLCALC 121* 07/08/2015   TRIG 60.0 07/08/2015   CHOLHDL 3 07/08/2015   .  Lab Results  Component Value Date   NA 137 07/08/2015   K 4.2 07/08/2015   CL 102 07/08/2015   CO2 28 07/08/2015   Lab Results  Component Value Date   CKTOTAL 118 07/08/2015   CKMB 2.0 07/08/2015        Relevant Orders   EKG 12-Lead (Completed)   DG Chest 2 View (Completed)   Ambulatory referral to Cardiology      I am having Ms. Jaques maintain her multivitamin with minerals and azithromycin.  Meds ordered this encounter  Medications  . azithromycin (ZITHROMAX) 250 MG tablet    Sig:     Refill:  0    There are no discontinued medications.  Follow-up: No Follow-up on file.   Crecencio Mc, MD

## 2015-07-09 NOTE — Telephone Encounter (Signed)
Please advise, thanks.

## 2015-07-10 DIAGNOSIS — R0789 Other chest pain: Secondary | ICD-10-CM | POA: Insufficient documentation

## 2015-07-10 DIAGNOSIS — R079 Chest pain, unspecified: Secondary | ICD-10-CM

## 2015-07-10 NOTE — Assessment & Plan Note (Addendum)
Her pain is atypical and she has no identifiable CRFS.  Cardiac enzymes, EKG and chest x ray were done today and no ischemic changes noted.  Reassurance provided. .Nonurgent referral to Dr Rockey Situ for evaluation  Has been made. Non fasting labs done today are normal:  Lab Results  Component Value Date   CHOL 212* 07/08/2015   HDL 78.80 07/08/2015   LDLCALC 121* 07/08/2015   TRIG 60.0 07/08/2015   CHOLHDL 3 07/08/2015   .  Lab Results  Component Value Date   NA 137 07/08/2015   K 4.2 07/08/2015   CL 102 07/08/2015   CO2 28 07/08/2015   Lab Results  Component Value Date   CKTOTAL 118 07/08/2015   CKMB 2.0 07/08/2015

## 2015-08-28 ENCOUNTER — Encounter: Payer: Self-pay | Admitting: Cardiovascular Disease

## 2015-08-28 ENCOUNTER — Ambulatory Visit (INDEPENDENT_AMBULATORY_CARE_PROVIDER_SITE_OTHER): Payer: BLUE CROSS/BLUE SHIELD | Admitting: Cardiovascular Disease

## 2015-08-28 VITALS — BP 120/62 | HR 60 | Ht 61.0 in | Wt 110.0 lb

## 2015-08-28 DIAGNOSIS — R079 Chest pain, unspecified: Secondary | ICD-10-CM

## 2015-08-28 NOTE — Progress Notes (Signed)
Patient ID: Kristi Coleman, female    DOB: 1956/01/28, 60 y.o.   MRN: [id number]  HPI Comments: Kristi Coleman is a very pleasant 60 year old woman with no significant cardiac disease, borderline hyperlipidemia, who presents by referral from Dr. Derrel Nip for consultation of chronic chest pain symptoms  She reports that she has had a pressure in the left chest dating back several years. Feels like a "bubble" in her chest. Often comes and goes without warning, no intervention needed. Typically comes on at rest. Recently was at the dentist having a implant when she developed symptoms in the left chest, worse than her baseline. Blood pressure was checked and this was mildly elevated, [id number] systolic.  She is typically very active at baseline, takes care of chickens, 46. Expecting 60 more next year. 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 Denies any symptoms of shortness of breath or chest pain on exertion. Left side chest symptoms seem to come on more with stress  In the exam room today, she even reported having brief left-sided chest pain symptoms No pain on deep inspiration, no pain on palpation   EKG on today's visit shows normal sinus rhythm with rate 60 bpm, no significant ST or T-wave changes     Allergies  Allergen Reactions  . Codeine Nausea Only  . Ibuprofen Swelling    Current Outpatient Prescriptions on File Prior to Visit  Medication Sig Dispense Refill  . Multiple Vitamins-Minerals (MULTIVITAMIN WITH MINERALS) tablet Take 1 tablet by mouth daily.     No current facility-administered medications on file prior to visit.    Past Medical History  Diagnosis Date  . Asthma   . Constipation   . Uterine fibroid   . Degenerative arthritis of lumbar spine   . Menopause     Past Surgical History  Procedure Laterality Date  . Excisional hemorrhoidectomy  2005    [facility]  . Anal sphincterotomy  2004    [facility]    Social  History  reports that she has never smoked. She does not have any smokeless tobacco history on file. She reports that she drinks about 2.5 oz of alcohol per week. She reports that she does not use illicit drugs.  Family History family history includes Arthritis in her father and mother; Breast cancer in her maternal uncle and other; Cancer in her other; Diabetes in her mother; Heart disease in her other.    Review of Systems  Constitutional: Negative.   Respiratory: Positive for chest tightness.   Cardiovascular: Positive for chest pain.  Gastrointestinal: Negative.   Musculoskeletal: Negative.   Neurological: Negative.   Hematological: Negative.   Psychiatric/Behavioral: Negative.   All other systems reviewed and are negative.   BP 120/62 mmHg  Pulse 60  Ht 5\' 1"  (1.549 m)  Wt 110 lb (49.896 kg)  BMI 20.80 kg/m2  Physical Exam  Constitutional: She is oriented to person, place, and time. She appears well-developed and well-nourished.  HENT:  Head: Normocephalic.  Nose: Nose normal.  Mouth/Throat: Oropharynx is clear and moist.  Eyes: Conjunctivae are normal. Pupils are equal, round, and reactive to light.  Neck: Normal range of motion. Neck supple. No JVD present.  Cardiovascular: Normal rate, regular rhythm, normal heart sounds and intact distal pulses.  Exam reveals no gallop and no friction rub.   No murmur heard. Pulmonary/Chest: Effort normal and breath sounds normal. No respiratory distress. She has no wheezes. She has no  rales. She exhibits no tenderness.  Abdominal: Soft. Bowel sounds are normal. She exhibits no distension. There is no tenderness.  Musculoskeletal: Normal range of motion. She exhibits no edema or tenderness.  Lymphadenopathy:    She has no cervical adenopathy.  Neurological: She is alert and oriented to person, place, and time. Coordination normal.  Skin: Skin is warm and dry. No rash noted. No erythema.  Psychiatric: She has a normal mood and  affect. Her behavior is normal. Judgment and thought content normal.

## 2015-08-28 NOTE — Patient Instructions (Addendum)
You are doing well. No medication changes were made.  We will order a CT coronary calcium score for chest pain There is a one time fee of $150.00 due at the time of your procedure Wednesday, April 26 @ 3:00, please arrive @ 2:45 [address] [place] [phone]  Please call us if you have new issues that need to be addressed before your next appt.

## 2015-08-28 NOTE — Assessment & Plan Note (Signed)
Etiology of her left-sided chest pain is unclear, atypical in nature She is low risk of coronary artery disease, not a smoker, no diabetes, both cholesterol borderline elevated. There is some family history of coronary artery disease. Other differential includes musculoskeletal or GI. To help risk stratify her, we have ordered a CT coronary calcium score. If score is 0, she would be low likelihood of underlying ischemia or angina.  She has indicated that she would like to do this with a friend of hers, he was contacted today and will do the scan with her

## 2015-09-03 ENCOUNTER — Ambulatory Visit (INDEPENDENT_AMBULATORY_CARE_PROVIDER_SITE_OTHER)
Admission: RE | Admit: 2015-09-03 | Discharge: 2015-09-03 | Disposition: A | Discharge status: Home / Self Care | Payer: Self-pay | Source: Ambulatory Visit | Attending: Cardiovascular Disease | Admitting: Cardiovascular Disease

## 2015-09-03 DIAGNOSIS — R079 Chest pain, unspecified: Secondary | ICD-10-CM

## 2016-01-25 ENCOUNTER — Other Ambulatory Visit: Payer: Self-pay | Admitting: Internal Medicine

## 2016-01-25 ENCOUNTER — Encounter: Payer: Self-pay | Admitting: Internal Medicine

## 2016-01-25 DIAGNOSIS — R918 Other nonspecific abnormal finding of lung field: Secondary | ICD-10-CM | POA: Insufficient documentation

## 2016-01-25 DIAGNOSIS — R911 Solitary pulmonary nodule: Secondary | ICD-10-CM

## 2016-01-25 NOTE — Progress Notes (Signed)
IN reviewing the Cardia CT that was done by Dr Rockey Situ in late April,  Dr. Derrel Nip noticed that 2 tiny lung nodules were seen in the he Right lung . The radiologist has recommended repeat imaging at 6 months ,  St make sure they are not changing , followed by one final CT in 18 months.

## 2016-01-26 ENCOUNTER — Encounter: Payer: Self-pay | Admitting: Internal Medicine

## 2016-02-03 ENCOUNTER — Telehealth: Payer: Self-pay | Admitting: Cardiovascular Disease

## 2016-02-03 NOTE — Telephone Encounter (Signed)
Pt calling stating she did a CT score way back in April She was just seen by her PCP and they saw some spots on her lung  And now she is going for another CT  She is asking if this was seen on the last time she did it with [place] She states we told her everything looked fine then Please call patient

## 2016-02-03 NOTE — Telephone Encounter (Addendum)
Spoke w/ pt.  Advised her that our focus of the CT Calcium Score were her coronary arteries, but other parts are picked up. Advised that we typically see benign findings in nonsmokers and discussed this w/ her in detail. Advised that Dr. Derrel Nip is looking out for her by ordering the CT and that she should proceed w/ this to make sure that the nodules have not changed, as this test will look at her lungs in their entirety, not just the small window on the CT Ca Score. She is appreciative and will call back w/ any further questions or concerns.

## 2016-02-03 NOTE — Telephone Encounter (Signed)
Left message for pt to call back  °

## 2016-02-04 ENCOUNTER — Ambulatory Visit
Admission: RE | Admit: 2016-02-04 | Discharge: 2016-02-04 | Disposition: A | Discharge status: Home / Self Care | Payer: BLUE CROSS/BLUE SHIELD | Source: Ambulatory Visit | Attending: Internal Medicine | Admitting: Internal Medicine

## 2016-02-04 DIAGNOSIS — M47814 Spondylosis without myelopathy or radiculopathy, thoracic region: Secondary | ICD-10-CM | POA: Insufficient documentation

## 2016-02-04 DIAGNOSIS — R911 Solitary pulmonary nodule: Secondary | ICD-10-CM | POA: Diagnosis not present

## 2016-07-19 ENCOUNTER — Other Ambulatory Visit: Payer: Self-pay | Admitting: Internal Medicine

## 2016-07-19 DIAGNOSIS — H01006 Unspecified blepharitis left eye, unspecified eyelid: Secondary | ICD-10-CM | POA: Insufficient documentation

## 2016-07-19 DIAGNOSIS — H01004 Unspecified blepharitis left upper eyelid: Secondary | ICD-10-CM

## 2016-07-19 MED ORDER — DOXYCYCLINE HYCLATE 100 MG PO TABS: 100.0000 mg | ORAL_TABLET | Freq: Two times a day (BID) | ORAL | 0 refills | Status: DC

## 2016-07-26 ENCOUNTER — Other Ambulatory Visit: Payer: Self-pay | Admitting: Internal Medicine

## 2016-07-26 MED ORDER — ERYTHROMYCIN 2 % EX OINT: 0.5000 cm | TOPICAL_OINTMENT | Freq: Four times a day (QID) | CUTANEOUS | 0 refills | Status: DC

## 2016-08-18 ENCOUNTER — Telehealth: Payer: Self-pay | Admitting: Cardiovascular Disease

## 2016-08-18 NOTE — Telephone Encounter (Signed)
Pt calling stating on the back of her knee She has this cyst  She states it's been there a few weeks, it comes and goes. It pains her she states Not sure if it is a baker cyst, had one about 10-15 years ago or blood clot  Would like to know what to do  Please advise

## 2016-08-18 NOTE — Telephone Encounter (Signed)
Can you see if she wants to come in in the morning to let Dr. Rockey Situ take a look at it? He can order further testing if necessary.

## 2016-08-18 NOTE — Progress Notes (Signed)
Cardiology Office Note  Date:  08/19/2016   ID:  Kristi Coleman, Kristi Coleman 11/16/1955, MRN [id number]  PCP:  Crecencio Mc, MD   Chief Complaint  Patient presents with  . other    f/u per phone note-cyst. Pt denies cp, sob. Reviewed meds with pt verbally.    HPI:  Kristi Coleman is a very pleasant 61 year old woman with   borderline hyperlipidemia,  Chronic chest pain CT calcium score of 0 in 08/2015 She presents today for evaluation of Left posterior knee swelling and pain  She reports over the past 2 weeks or so she has had increasing swelling left posterior knee area Talked to a friend at work who worried that she might have a blood clot She denies any swelling of the calf, shin or further down the leg but does have radiating pain worse in the past several days, sometimes with tingling and burning Difficulty walking and weightbearing She has tried NSAIDs with mild relief  Apart from this she has been doing well with no new complaints  EKG Review person by myself on today's visit shows normal sinus rhythm with rate 65 bpm, no significant ST or T-wave changes  Other past medical history pressure in the left chest dating back several years. Feels like a "bubble" in her chest. Often comes and goes without warning, no intervention needed. Typically comes on at rest.   at the dentist when she developed symptoms in the left chest  She is typically very active at baseline, takes care of chickens, 83. Expecting 60 more next year. 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 Denies any symptoms of shortness of breath or chest pain on exertion. Left side chest symptoms seem to come on more with stress  No pain on deep inspiration, no pain on palpation      PMH:   has a past medical history of Asthma; Constipation; Degenerative arthritis of lumbar spine; Menopause; and Uterine fibroid.  PSH:    Past Surgical History:  Procedure  Laterality Date  . ANAL SPHINCTEROTOMY  2004   [facility]  . EXCISIONAL HEMORRHOIDECTOMY  2005   [facility]    Current Outpatient Prescriptions  Medication Sig Dispense Refill  . Multiple Vitamins-Minerals (MULTIVITAMIN WITH MINERALS) tablet Take 1 tablet by mouth daily.    . Probiotic Product ([place]) Take by mouth daily.     No current facility-administered medications for this visit.      Allergies:   Codeine and Ibuprofen   Social History:  The patient  reports that she has never smoked. She has never used smokeless tobacco. She reports that she drinks about 2.5 oz of alcohol per week . She reports that she does not use drugs.   Family History:   family history includes Arthritis in her father and mother; Breast cancer in her maternal uncle and other; Cancer in her other; Colon cancer in her father; Diabetes in her mother; Heart disease in her other; Hyperlipidemia in her brother and brother; Hypertension in her brother and brother.    Review of Systems: Review of Systems  Constitutional: Negative.   Respiratory: Negative.   Cardiovascular: Negative.   Gastrointestinal: Negative.   Musculoskeletal: Positive for joint pain.       Swelling behind left knee  Neurological: Negative.   Psychiatric/Behavioral: Negative.   All other systems reviewed and are negative.    PHYSICAL EXAM: VS:  BP 98/66 (BP Location: Left  Arm, Patient Position: Sitting, Cuff Size: Normal)   Pulse 65   Ht 5\' 1"  (1.549 m)   Wt 112 lb 8 oz (51 kg)   BMI 21.26 kg/m  , BMI Body mass index is 21.26 kg/m. GEN: Well nourished, well developed, in no acute distress  HEENT: normal  Neck: no JVD, carotid bruits, or masses Cardiac: RRR; no murmurs, rubs, or gallops,no edema  Respiratory:  clear to auscultation bilaterally, normal work of breathing GI: soft, nontender, nondistended, + BS MS: no deformity or atrophy , Region of fullness behind the left knee, Soft, Minimally tender,  compared  to the right Skin: warm and dry, no rash Neuro:  Strength and sensation are intact Psych: euthymic mood, full affect    Recent Labs: No results found for requested labs within last 8760 hours.    Lipid Panel Lab Results  Component Value Date   CHOL 212 (H) 07/08/2015   HDL 78.80 07/08/2015   LDLCALC 121 (H) 07/08/2015   TRIG 60.0 07/08/2015      Wt Readings from Last 3 Encounters:  08/19/16 112 lb 8 oz (51 kg)  08/28/15 110 lb (49.9 kg)  07/08/15 109 lb 4 oz (49.6 kg)       ASSESSMENT AND PLAN:  Other chest pain Did not discuss her chest pain symptoms on today's visit No underlying coronary disease, unable to exclude spasm  Nodule of right lung Stable on CT  Synovial cyst of knee, unspecified laterality She is interested in lower extremity Doppler to rule out DVT Suspect this is not a DVT but we have ordered lower extremity venous Doppler to estimate size of fluid collection behind the left knee. We'll discuss with Dr. Derrel Nip, Recommended Ace wrap, icing, NSAIDs through today, with massage. As she is very symptomatic, she may need cortisone injection.    Total encounter time more than 15 minutes  Greater than 50% was spent in counseling and coordination of care with the patient   Disposition:   F/U  12 months  No orders of the defined types were placed in this encounter.    Signed, Esmond Plants, M.D., Ph.D. 08/19/2016  [place], [place]

## 2016-08-19 ENCOUNTER — Ambulatory Visit (INDEPENDENT_AMBULATORY_CARE_PROVIDER_SITE_OTHER): Payer: BLUE CROSS/BLUE SHIELD | Admitting: Cardiovascular Disease

## 2016-08-19 ENCOUNTER — Encounter: Payer: Self-pay | Admitting: Cardiovascular Disease

## 2016-08-19 VITALS — BP 98/66 | HR 65 | Ht 61.0 in | Wt 112.5 lb

## 2016-08-19 DIAGNOSIS — R0789 Other chest pain: Secondary | ICD-10-CM

## 2016-08-19 DIAGNOSIS — R911 Solitary pulmonary nodule: Secondary | ICD-10-CM

## 2016-08-19 DIAGNOSIS — M7120 Synovial cyst of popliteal space [Baker], unspecified knee: Secondary | ICD-10-CM | POA: Diagnosis not present

## 2016-08-19 NOTE — Telephone Encounter (Signed)
Pt came in today to see Dr Rockey Situ

## 2016-08-19 NOTE — Patient Instructions (Addendum)
Medication Instructions:   No medication changes made  Labwork:  No new labs needed  Testing/Procedures:  We will schedule an u/s for tomorrow for bakers cyst   I recommend watching educational videos on topics of interest to you at:       www.goemmi.com  Enter code: HEARTCARE    Follow-Up: It was a pleasure seeing you in the office today. Please call us if you have new issues that need to be addressed before your next appt.  [phone]  Your physician wants you to follow-up in: 12 months.  You will receive a reminder letter in the mail two months in advance. If you don't receive a letter, please call our office to schedule the follow-up appointment.  If you need a refill on your cardiac medications before your next appointment, please call your pharmacy.

## 2016-08-20 ENCOUNTER — Other Ambulatory Visit: Payer: Self-pay | Admitting: Cardiovascular Disease

## 2016-08-20 ENCOUNTER — Ambulatory Visit: Payer: BLUE CROSS/BLUE SHIELD

## 2016-08-20 DIAGNOSIS — M7120 Synovial cyst of popliteal space [Baker], unspecified knee: Secondary | ICD-10-CM | POA: Diagnosis not present

## 2016-08-20 DIAGNOSIS — G8929 Other chronic pain: Secondary | ICD-10-CM

## 2016-08-20 DIAGNOSIS — M79605 Pain in left leg: Secondary | ICD-10-CM

## 2016-08-20 DIAGNOSIS — R0789 Other chest pain: Secondary | ICD-10-CM

## 2016-08-30 ENCOUNTER — Other Ambulatory Visit: Payer: Self-pay | Admitting: Internal Medicine

## 2016-08-30 DIAGNOSIS — M25562 Pain in left knee: Secondary | ICD-10-CM

## 2016-09-09 ENCOUNTER — Ambulatory Visit
Admission: RE | Admit: 2016-09-09 | Discharge: 2016-09-09 | Disposition: A | Discharge status: Home / Self Care | Payer: BLUE CROSS/BLUE SHIELD | Source: Ambulatory Visit | Attending: Internal Medicine | Admitting: Internal Medicine

## 2016-09-09 ENCOUNTER — Encounter: Payer: Self-pay | Admitting: Internal Medicine

## 2016-09-09 DIAGNOSIS — M7122 Synovial cyst of popliteal space [Baker], left knee: Secondary | ICD-10-CM | POA: Diagnosis not present

## 2016-09-09 DIAGNOSIS — M25562 Pain in left knee: Secondary | ICD-10-CM | POA: Diagnosis not present

## 2016-09-09 DIAGNOSIS — M25462 Effusion, left knee: Secondary | ICD-10-CM | POA: Diagnosis not present

## 2016-09-09 DIAGNOSIS — X58XXXA Exposure to other specified factors, initial encounter: Secondary | ICD-10-CM | POA: Diagnosis not present

## 2016-09-09 DIAGNOSIS — S83232A Complex tear of medial meniscus, current injury, left knee, initial encounter: Secondary | ICD-10-CM | POA: Insufficient documentation

## 2016-09-09 DIAGNOSIS — M948X6 Other specified disorders of cartilage, lower leg: Secondary | ICD-10-CM | POA: Diagnosis not present

## 2016-10-09 ENCOUNTER — Other Ambulatory Visit: Payer: Self-pay | Admitting: Internal Medicine

## 2016-10-11 MED ORDER — DOXYCYCLINE HYCLATE 100 MG PO TABS: 100.0000 mg | ORAL_TABLET | Freq: Two times a day (BID) | ORAL | 0 refills | Status: DC

## 2016-11-04 ENCOUNTER — Other Ambulatory Visit: Payer: Self-pay | Admitting: Internal Medicine

## 2016-11-04 MED ORDER — CIPROFLOXACIN HCL 500 MG PO TABS
500.0000 mg | ORAL_TABLET | Freq: Two times a day (BID) | ORAL | 0 refills | Status: DC
Start: 2016-11-04 — End: 2017-10-24

## 2016-11-04 NOTE — Progress Notes (Unsigned)
cip

## 2016-11-05 ENCOUNTER — Other Ambulatory Visit: Payer: Self-pay | Admitting: Internal Medicine

## 2016-11-09 ENCOUNTER — Other Ambulatory Visit: Payer: Self-pay | Admitting: Internal Medicine

## 2016-11-09 DIAGNOSIS — Z1231 Encounter for screening mammogram for malignant neoplasm of breast: Secondary | ICD-10-CM

## 2016-12-01 ENCOUNTER — Ambulatory Visit
Admission: RE | Admit: 2016-12-01 | Discharge: 2016-12-01 | Disposition: A | Discharge status: Home / Self Care | Payer: BLUE CROSS/BLUE SHIELD | Source: Ambulatory Visit | Attending: Internal Medicine | Admitting: Internal Medicine

## 2016-12-01 DIAGNOSIS — Z1231 Encounter for screening mammogram for malignant neoplasm of breast: Secondary | ICD-10-CM | POA: Insufficient documentation

## 2016-12-02 ENCOUNTER — Encounter: Payer: Self-pay | Admitting: Internal Medicine

## 2016-12-17 NOTE — Telephone Encounter (Signed)
Mailed unread message to patient, thanks 

## 2017-03-22 ENCOUNTER — Telehealth: Payer: Self-pay | Admitting: Internal Medicine

## 2017-05-04 NOTE — Telephone Encounter (Signed)
Message sent

## 2017-10-04 ENCOUNTER — Other Ambulatory Visit: Payer: Self-pay | Admitting: Internal Medicine

## 2017-10-04 DIAGNOSIS — M79671 Pain in right foot: Secondary | ICD-10-CM

## 2017-10-06 ENCOUNTER — Ambulatory Visit: Payer: Self-pay | Admitting: Podiatry

## 2017-10-18 ENCOUNTER — Other Ambulatory Visit: Payer: Self-pay | Admitting: Internal Medicine

## 2017-10-18 DIAGNOSIS — Z1231 Encounter for screening mammogram for malignant neoplasm of breast: Secondary | ICD-10-CM

## 2017-10-24 ENCOUNTER — Other Ambulatory Visit (HOSPITAL_COMMUNITY)
Admission: RE | Admit: 2017-10-24 | Discharge: 2017-10-24 | Disposition: A | Discharge status: Home / Self Care | Payer: BLUE CROSS/BLUE SHIELD | Source: Ambulatory Visit | Attending: Internal Medicine | Admitting: Internal Medicine

## 2017-10-24 ENCOUNTER — Encounter: Payer: Self-pay | Admitting: Internal Medicine

## 2017-10-24 ENCOUNTER — Ambulatory Visit (INDEPENDENT_AMBULATORY_CARE_PROVIDER_SITE_OTHER): Payer: BLUE CROSS/BLUE SHIELD | Admitting: Internal Medicine

## 2017-10-24 VITALS — BP 130/84 | HR 74 | Temp 98.5°F | Resp 15 | Ht 61.0 in | Wt 112.0 lb

## 2017-10-24 DIAGNOSIS — Z13220 Encounter for screening for lipoid disorders: Secondary | ICD-10-CM

## 2017-10-24 DIAGNOSIS — R5383 Other fatigue: Secondary | ICD-10-CM | POA: Diagnosis not present

## 2017-10-24 DIAGNOSIS — R918 Other nonspecific abnormal finding of lung field: Secondary | ICD-10-CM

## 2017-10-24 DIAGNOSIS — Z87828 Personal history of other (healed) physical injury and trauma: Secondary | ICD-10-CM | POA: Insufficient documentation

## 2017-10-24 DIAGNOSIS — R911 Solitary pulmonary nodule: Secondary | ICD-10-CM

## 2017-10-24 DIAGNOSIS — K120 Recurrent oral aphthae: Secondary | ICD-10-CM

## 2017-10-24 DIAGNOSIS — Z86018 Personal history of other benign neoplasm: Secondary | ICD-10-CM | POA: Diagnosis not present

## 2017-10-24 DIAGNOSIS — Z124 Encounter for screening for malignant neoplasm of cervix: Secondary | ICD-10-CM | POA: Insufficient documentation

## 2017-10-24 DIAGNOSIS — Z0000 Encounter for general adult medical examination without abnormal findings: Secondary | ICD-10-CM | POA: Diagnosis not present

## 2017-10-24 DIAGNOSIS — E559 Vitamin D deficiency, unspecified: Secondary | ICD-10-CM | POA: Diagnosis not present

## 2017-10-24 MED ORDER — ZOSTER VAC RECOMB ADJUVANTED 50 MCG/0.5ML IM SUSR: 0.5000 mL | Freq: Once | INTRAMUSCULAR | 1 refills | Status: AC

## 2017-10-24 NOTE — Patient Instructions (Signed)
I am referring you to Dr Garwin Brothers to have your fibroids imaged again  I will get the colonoscopy report from Dr Vira Agar  From 2015   The ShingRx vaccine is now available in local pharmacies and is much more protective thant Zostavaxs,  It is therefore ADVISED for all interested adults over 96 to prevent shingles   Labs today    Health Maintenance for Postmenopausal Women Menopause is a normal process in which your reproductive ability comes to an end. This process happens gradually over a span of months to years, usually between the ages of 2 and 96. Menopause is complete when you have missed 12 consecutive menstrual periods. It is important to talk with your health care provider about some of the most common conditions that affect postmenopausal women, such as heart disease, cancer, and bone loss (osteoporosis). Adopting a healthy lifestyle and getting preventive care can help to promote your health and wellness. Those actions can also lower your chances of developing some of these common conditions. What should I know about menopause? During menopause, you may experience a number of symptoms, such as:  Moderate-to-severe hot flashes.  Night sweats.  Decrease in sex drive.  Mood swings.  Headaches.  Tiredness.  Irritability.  Memory problems.  Insomnia.  Choosing to treat or not to treat menopausal changes is an individual decision that you make with your health care provider. What should I know about hormone replacement therapy and supplements? Hormone therapy products are effective for treating symptoms that are associated with menopause, such as hot flashes and night sweats. Hormone replacement carries certain risks, especially as you become older. If you are thinking about using estrogen or estrogen with progestin treatments, discuss the benefits and risks with your health care provider. What should I know about heart disease and stroke? Heart disease, heart attack, and  stroke become more likely as you age. This may be due, in part, to the hormonal changes that your body experiences during menopause. These can affect how your body processes dietary fats, triglycerides, and cholesterol. Heart attack and stroke are both medical emergencies. There are many things that you can do to help prevent heart disease and stroke:  Have your blood pressure checked at least every 1-2 years. High blood pressure causes heart disease and increases the risk of stroke.  If you are 34-48 years old, ask your health care provider if you should take aspirin to prevent a heart attack or a stroke.  Do not use any tobacco products, including cigarettes, chewing tobacco, or electronic cigarettes. If you need help quitting, ask your health care provider.  It is important to eat a healthy diet and maintain a healthy weight. ? Be sure to include plenty of vegetables, fruits, low-fat dairy products, and lean protein. ? Avoid eating foods that are high in solid fats, added sugars, or salt (sodium).  Get regular exercise. This is one of the most important things that you can do for your health. ? Try to exercise for at least 150 minutes each week. The type of exercise that you do should increase your heart rate and make you sweat. This is known as moderate-intensity exercise. ? Try to do strengthening exercises at least twice each week. Do these in addition to the moderate-intensity exercise.  Know your numbers.Ask your health care provider to check your cholesterol and your blood glucose. Continue to have your blood tested as directed by your health care provider.  What should I know about cancer screening? There are  several types of cancer. Take the following steps to reduce your risk and to catch any cancer development as early as possible. Breast Cancer  Practice breast self-awareness. ? This means understanding how your breasts normally appear and feel. ? It also means doing regular  breast self-exams. Let your health care provider know about any changes, no matter how small.  If you are 49 or older, have a clinician do a breast exam (clinical breast exam or CBE) every year. Depending on your age, family history, and medical history, it may be recommended that you also have a yearly breast X-ray (mammogram).  If you have a family history of breast cancer, talk with your health care provider about genetic screening.  If you are at high risk for breast cancer, talk with your health care provider about having an MRI and a mammogram every year.  Breast cancer (BRCA) gene test is recommended for women who have family members with BRCA-related cancers. Results of the assessment will determine the need for genetic counseling and BRCA1 and for BRCA2 testing. BRCA-related cancers include these types: ? Breast. This occurs in males or females. ? Ovarian. ? Tubal. This may also be called fallopian tube cancer. ? Cancer of the abdominal or pelvic lining (peritoneal cancer). ? Prostate. ? Pancreatic.  Cervical, Uterine, and Ovarian Cancer Your health care provider may recommend that you be screened regularly for cancer of the pelvic organs. These include your ovaries, uterus, and vagina. This screening involves a pelvic exam, which includes checking for microscopic changes to the surface of your cervix (Pap test).  For women ages 21-65, health care providers may recommend a pelvic exam and a Pap test every three years. For women ages 28-65, they may recommend the Pap test and pelvic exam, combined with testing for human papilloma virus (HPV), every five years. Some types of HPV increase your risk of cervical cancer. Testing for HPV may also be done on women of any age who have unclear Pap test results.  Other health care providers may not recommend any screening for nonpregnant women who are considered low risk for pelvic cancer and have no symptoms. Ask your health care provider if a  screening pelvic exam is right for you.  If you have had past treatment for cervical cancer or a condition that could lead to cancer, you need Pap tests and screening for cancer for at least 20 years after your treatment. If Pap tests have been discontinued for you, your risk factors (such as having a new sexual partner) need to be reassessed to determine if you should start having screenings again. Some women have medical problems that increase the chance of getting cervical cancer. In these cases, your health care provider may recommend that you have screening and Pap tests more often.  If you have a family history of uterine cancer or ovarian cancer, talk with your health care provider about genetic screening.  If you have vaginal bleeding after reaching menopause, tell your health care provider.  There are currently no reliable tests available to screen for ovarian cancer.  Lung Cancer Lung cancer screening is recommended for adults 53-31 years old who are at high risk for lung cancer because of a history of smoking. A yearly low-dose CT scan of the lungs is recommended if you:  Currently smoke.  Have a history of at least 30 pack-years of smoking and you currently smoke or have quit within the past 15 years. A pack-year is smoking an average of  one pack of cigarettes per day for one year.  Yearly screening should:  Continue until it has been 15 years since you quit.  Stop if you develop a health problem that would prevent you from having lung cancer treatment.  Colorectal Cancer  This type of cancer can be detected and can often be prevented.  Routine colorectal cancer screening usually begins at age 42 and continues through age 65.  If you have risk factors for colon cancer, your health care provider may recommend that you be screened at an earlier age.  If you have a family history of colorectal cancer, talk with your health care provider about genetic screening.  Your health  care provider may also recommend using home test kits to check for hidden blood in your stool.  A small camera at the end of a tube can be used to examine your colon directly (sigmoidoscopy or colonoscopy). This is done to check for the earliest forms of colorectal cancer.  Direct examination of the colon should be repeated every 5-10 years until age 40. However, if early forms of precancerous polyps or small growths are found or if you have a family history or genetic risk for colorectal cancer, you may need to be screened more often.  Skin Cancer  Check your skin from head to toe regularly.  Monitor any moles. Be sure to tell your health care provider: ? About any new moles or changes in moles, especially if there is a change in a mole's shape or color. ? If you have a mole that is larger than the size of a pencil eraser.  If any of your family members has a history of skin cancer, especially at a young age, talk with your health care provider about genetic screening.  Always use sunscreen. Apply sunscreen liberally and repeatedly throughout the day.  Whenever you are outside, protect yourself by wearing long sleeves, pants, a wide-brimmed hat, and sunglasses.  What should I know about osteoporosis? Osteoporosis is a condition in which bone destruction happens more quickly than new bone creation. After menopause, you may be at an increased risk for osteoporosis. To help prevent osteoporosis or the bone fractures that can happen because of osteoporosis, the following is recommended:  If you are 53-66 years old, get at least 1,000 mg of calcium and at least 600 mg of vitamin D per day.  If you are older than age 33 but younger than age 55, get at least 1,200 mg of calcium and at least 600 mg of vitamin D per day.  If you are older than age 5, get at least 1,200 mg of calcium and at least 800 mg of vitamin D per day.  Smoking and excessive alcohol intake increase the risk of  osteoporosis. Eat foods that are rich in calcium and vitamin D, and do weight-bearing exercises several times each week as directed by your health care provider. What should I know about how menopause affects my mental health? Depression may occur at any age, but it is more common as you become older. Common symptoms of depression include:  Low or sad mood.  Changes in sleep patterns.  Changes in appetite or eating patterns.  Feeling an overall lack of motivation or enjoyment of activities that you previously enjoyed.  Frequent crying spells.  Talk with your health care provider if you think that you are experiencing depression. What should I know about immunizations? It is important that you get and maintain your immunizations. These include:  Tetanus, diphtheria, and pertussis (Tdap) booster vaccine.  Influenza every year before the flu season begins.  Pneumonia vaccine.  Shingles vaccine.  Your health care provider may also recommend other immunizations. This information is not intended to replace advice given to you by your health care provider. Make sure you discuss any questions you have with your health care provider. Document Released: 06/18/2005 Document Revised: 11/14/2015 Document Reviewed: 01/28/2015 Elsevier Interactive Patient Education  2018 [employer].

## 2017-10-24 NOTE — Progress Notes (Signed)
Patient ID: Kristi Coleman, female    DOB: May 30, 1955  Age: 62 y.o. MRN: [id number]  The patient is here for annual preventive examination Her last visit was in 2017  Last PAP smear 2013 .  No history of abnormals.  Has not been sexually active in over a decade.    History of several uterine  fibroids that were last  imaged  By Dr Kristi Coleman In 2010 and have been los to follow up.  Feels they may have enlarged because for the last year her stools have been smaller in caliber.  Due for mammogram Colonoscopy was done in  2015by Dr. Vira Coleman;  reportedly normal no procedural or path report available  Last eye exam 2018 Kristi Coleman  Had a cardiac CT done in April 2017,  Calcium score was zero,  But two sub centimeter pulmonary nodules were seen and  Repeat imaging in Sept 2017 showed stability.he is overdue for/follow up.  Non smoker.     The risk factors are reflected in the social history.  The roster of all physicians providing medical care to patient - is listed in the Snapshot section of the chart.  Activities of daily living:  The patient is 100% independent in all ADLs: dressing, toileting, feeding as well as independent mobility  Home safety : The patient has smoke detectors in the home. They wear seatbelts.  There are SECURED firearms at home. There is no violence in the home.   There is no risks for hepatitis, STDs or HIV. There is no   history of blood transfusion. They have no travel history to infectious disease endemic areas of the world.  The patient has seen their dentist in the last six month. They have seen their eye doctor in the last year.    They have deferred audiologic testing in the last year.  They do not  have excessive sun exposure. Discussed the need for sun protection: hats, long sleeves and use of sunscreen if there is significant sun exposure.   Diet: the importance of a healthy diet is discussed. They do have a healthy diet.  The benefits of regular aerobic exercise  were discussed. She walks 4 times per week ,  20 minutes.   Depression screen: there are no signs or vegative symptoms of depression- irritability, change in appetite, anhedonia, sadness/tearfullness.  Cognitive assessment: the patient manages all their financial and personal affairs and is actively engaged. They could relate day,date,year and events; recalled 2/3 objects at 3 minutes; performed clock-face test normally.  The following portions of the patient's history were reviewed and updated as appropriate: allergies, current medications, past family history, past medical history,  past surgical history, past social history  and problem list.  Visual acuity was not assessed per patient preference since she has regular follow up with her ophthalmologist. Hearing and body mass index were assessed and reviewed.   During the course of the visit the patient was educated and counseled about appropriate screening and preventive services including : fall prevention , diabetes screening, nutrition counseling, colorectal cancer screening, and recommended immunizations.    CC: The primary encounter diagnosis was Screening for cervical cancer. Diagnoses of History of meniscal tear, Fatigue, unspecified type, Screening for hyperlipidemia, Vitamin D deficiency, History of uterine fibroid, Nodule of right lung, Recurrent aphthous stomatitis, Encounter for preventive health examination, and Abnormal findings on diagnostic imaging of lung were also pertinent to this visit.  History Kristi Coleman has a past medical history of Asthma, Constipation, Degenerative arthritis of  lumbar spine, Menopause, and Uterine fibroid.   She has a past surgical history that includes Excisional hemorrhoidectomy (2005) and Anal sphincterotomy (2004).   Her family history includes Arthritis in her father and mother; Breast cancer in her maternal grandmother and maternal uncle; Cancer in her other; Colon cancer in her father; Diabetes in her  mother; Heart disease in her other; Hyperlipidemia in her brother and brother; Hypertension in her brother and brother.She reports that she has never smoked. She has never used smokeless tobacco. She reports that she drinks about 3.0 oz of alcohol per week. She reports that she does not use drugs.  Outpatient Medications Prior to Visit  Medication Sig Dispense Refill  . ciprofloxacin (CIPRO) 500 MG tablet Take 1 tablet (500 mg total) by mouth 2 (two) times daily. (Patient not taking: Reported on 10/24/2017) 14 tablet 0  . doxycycline (VIBRA-TABS) 100 MG tablet Take 1 tablet (100 mg total) by mouth 2 (two) times daily. (Patient not taking: Reported on 10/24/2017) 20 tablet 0  . Multiple Vitamins-Minerals (MULTIVITAMIN WITH MINERALS) tablet Take 1 tablet by mouth daily.    . Probiotic Product ([place]) Take by mouth daily.     No facility-administered medications prior to visit.     Review of Systems   Patient denies headache, fevers, malaise, unintentional weight loss, skin rash, eye pain, sinus congestion and sinus pain, sore throat, dysphagia,  hemoptysis , cough, dyspnea, wheezing, chest pain, palpitations, orthopnea, edema, abdominal pain, nausea, melena, diarrhea, constipation, flank pain, dysuria, hematuria, urinary  Frequency, nocturia, numbness, tingling, seizures,  Focal weakness, Loss of consciousness,  Tremor, insomnia, depression, anxiety, and suicidal ideation.      Objective:  BP 130/84 (BP Location: Left Arm, Patient Position: Sitting, Cuff Size: Normal)   Pulse 74   Temp 98.5 F (36.9 C) (Oral)   Resp 15   Ht 5\' 1"  (1.549 m)   Wt 112 lb (50.8 kg)   SpO2 98%   BMI 21.16 kg/m   Physical Exam   General Appearance:    Alert, cooperative, no distress, appears stated age  Head:    Normocephalic, without obvious abnormality, atraumatic  Eyes:    PERRL, conjunctiva/corneas clear, EOM's intact, fundi    benign, both eyes  Ears:    Normal TM's and external ear  canals, both ears  Nose:   Nares normal, septum midline, mucosa normal, no drainage    or sinus tenderness  Throat:   Lips, mucosa, and tongue normal; teeth and gums normal  Neck:   Supple, symmetrical, trachea midline, no adenopathy;    thyroid:  no enlargement/tenderness/nodules; no carotid   bruit or JVD  Back:     Symmetric, no curvature, ROM normal, no CVA tenderness  Lungs:     Clear to auscultation bilaterally, respirations unlabored  Chest Wall:    No tenderness or deformity   Heart:    Regular rate and rhythm, S1 and S2 normal, no murmur, rub   or gallop  Breast Exam:    No tenderness, masses, or nipple abnormality  Abdomen:     Soft, non-tender, bowel sounds active all four quadrants,    no masses, no organomegaly  Genitalia:    Pelvic: cervix normal in appearance, external genitalia normal, no adnexal masses or tenderness, no cervical motion tenderness, rectovaginal septum normal, fibroid palpable on manual exam,  Atrophic changes noted to vaginal walls. Vaginal stenosis noted with speculum exam.  no discharge   Extremities:   Extremities normal, atraumatic, no  cyanosis or edema  Pulses:   2+ and symmetric all extremities  Skin:   Skin color, texture, turgor normal, no rashes or lesions  Lymph nodes:   Cervical, supraclavicular, and axillary nodes normal  Neurologic:   CNII-XII intact, normal strength, sensation and reflexes    throughout      Assessment & Plan:   Problem List Items Addressed This Visit    Recurrent aphthous stomatitis    She has no signs or symptoms of celiac disease, SLE or nutritional deficiencies.       Nodule of right lung    Repeat CT will be scheduled. Patient is a lifelong nonsmoker       Relevant Orders   CT CHEST NODULE FOLLOW UP LOW DOSE W/O   History of uterine fibroid    She was undergoing annual surveillance until 2010.  Fibroid is palpable on exam and she believes it is large enough to affect her ability to pass large stools.   Referral to Dr Garwin Brothers for evaluation .  If small,  Discussed referral to GI for change in bowel habits.       Relevant Orders   Ambulatory referral to Gynecology   History of meniscal tear    She has no pain with active lifestyle,  No orthopedic follow up needed.       Encounter for preventive health examination    Annual comprehensive preventive exam was done as well as an evaluation and management of chronic conditions .  During the course of the visit the patient was educated and counseled about appropriate screening and preventive services including :  diabetes screening, lipid analysis with projected  10 year  risk for CAD , nutrition counseling, breast, cervical and colorectal cancer screening, and recommended immunizations.  Printed recommendations for health maintenance screenings was given         Other Visit Diagnoses    Screening for cervical cancer    -  Primary   Relevant Orders   Cytology - PAP   Fatigue, unspecified type       Relevant Orders   Comprehensive metabolic panel   TSH   CBC with Differential/Platelet   B12   Iron, TIBC and Ferritin Panel   Screening for hyperlipidemia       Relevant Orders   Lipid panel   Vitamin D deficiency       Relevant Orders   VITAMIN D 25 Hydroxy (Vit-D Deficiency, Fractures)   Abnormal findings on diagnostic imaging of lung       Relevant Orders   CT CHEST NODULE FOLLOW UP LOW DOSE W/O      I have discontinued Sagrario M. Hitzeman's multivitamin with minerals, Probiotic Product ([place]), doxycycline, and ciprofloxacin. I am also having her start on Zoster Vaccine Adjuvanted.  Meds ordered this encounter  Medications  . Zoster Vaccine Adjuvanted [facility]) injection    Sig: Inject 0.5 mLs into the muscle once for 1 dose.    Dispense:  1 each    Refill:  1    Medications Discontinued During This Encounter  Medication Reason  . ciprofloxacin (CIPRO) 500 MG tablet Completed Course  . doxycycline  (VIBRA-TABS) 100 MG tablet Completed Course  . Multiple Vitamins-Minerals (MULTIVITAMIN WITH MINERALS) tablet Patient has not taken in last 30 days  . Probiotic Product ([place]) Patient has not taken in last 30 days    Follow-up: No follow-ups on file.   Crecencio Mc, MD

## 2017-10-25 DIAGNOSIS — Z86018 Personal history of other benign neoplasm: Secondary | ICD-10-CM | POA: Insufficient documentation

## 2017-10-25 DIAGNOSIS — Z0000 Encounter for general adult medical examination without abnormal findings: Secondary | ICD-10-CM | POA: Insufficient documentation

## 2017-10-25 LAB — VITAMIN B12: Vitamin B-12: 649 pg/mL (ref 211–911)

## 2017-10-25 LAB — COMPREHENSIVE METABOLIC PANEL
ALT: 20 U/L (ref 0–35)
AST: 21 U/L (ref 0–37)
Albumin: 4.5 g/dL (ref 3.5–5.2)
Alkaline Phosphatase: 58 U/L (ref 39–117)
BUN: 18 mg/dL (ref 6–23)
CO2: 28 mEq/L (ref 19–32)
Calcium: 9.5 mg/dL (ref 8.4–10.5)
Chloride: 104 mEq/L (ref 96–112)
Creatinine, Ser: 0.96 mg/dL (ref 0.40–1.20)
GFR: 62.57 mL/min (ref >60.00)
Glucose, Bld: 85 mg/dL (ref 70–99)
Potassium: 4.1 mEq/L (ref 3.5–5.1)
Sodium: 140 mEq/L (ref 135–145)
Total Bilirubin: 0.3 mg/dL (ref 0.2–1.2)
Total Protein: 6.9 g/dL (ref 6.0–8.3)

## 2017-10-25 LAB — CBC WITH DIFFERENTIAL/PLATELET
Basophils Absolute: 0 10*3/uL (ref 0.0–0.1)
Basophils Relative: 0.8 % (ref 0.0–3.0)
Eosinophils Absolute: 0.2 10*3/uL (ref 0.0–0.7)
Eosinophils Relative: 3.2 % (ref 0.0–5.0)
HCT: 35.8 % — ABNORMAL LOW (ref 36.0–46.0)
Hemoglobin: 12.2 g/dL (ref 12.0–15.0)
Lymphocytes Relative: 30.2 % (ref 12.0–46.0)
Lymphs Abs: 1.7 10*3/uL (ref 0.7–4.0)
MCHC: 34 g/dL (ref 30.0–36.0)
MCV: 93.5 fl (ref 78.0–100.0)
Monocytes Absolute: 0.6 10*3/uL (ref 0.1–1.0)
Monocytes Relative: 10.9 % (ref 3.0–12.0)
Neutro Abs: 3.1 10*3/uL (ref 1.4–7.7)
Neutrophils Relative %: 54.9 % (ref 43.0–77.0)
Platelets: 176 10*3/uL (ref 150.0–400.0)
RBC: 3.83 Mil/uL — ABNORMAL LOW (ref 3.87–5.11)
RDW: 12.8 % (ref 11.5–15.5)
WBC: 5.6 10*3/uL (ref 4.0–10.5)

## 2017-10-25 LAB — VITAMIN D 25 HYDROXY (VIT D DEFICIENCY, FRACTURES): VITD: 24.89 ng/mL — ABNORMAL LOW (ref 30.00–100.00)

## 2017-10-25 LAB — IRON,TIBC AND FERRITIN PANEL
%SAT: 37 % (calc) (ref 16–45)
Ferritin: 194 ng/mL (ref 16–288)
Iron: 94 ug/dL (ref 45–160)
TIBC: 252 mcg/dL (calc) (ref 250–450)

## 2017-10-25 LAB — TSH: TSH: 2.31 u[IU]/mL (ref 0.35–4.50)

## 2017-10-25 LAB — LIPID PANEL
Cholesterol: 209 mg/dL — ABNORMAL HIGH (ref 0–200)
HDL: 89.5 mg/dL (ref >39.00)
LDL Cholesterol: 110 mg/dL — ABNORMAL HIGH (ref 0–99)
NonHDL: 119.87
Total CHOL/HDL Ratio: 2
Triglycerides: 51 mg/dL (ref 0.0–149.0)
VLDL: 10.2 mg/dL (ref 0.0–40.0)

## 2017-10-25 NOTE — Assessment & Plan Note (Signed)
Repeat CT will be scheduled. Patient is a lifelong nonsmoker

## 2017-10-25 NOTE — Assessment & Plan Note (Signed)
She has no pain with active lifestyle,  No orthopedic follow up needed.

## 2017-10-25 NOTE — Assessment & Plan Note (Addendum)
She was undergoing annual surveillance until 2010.  Fibroid is palpable on exam and she believes it is large enough to affect her ability to pass large stools.  Referral to Dr Garwin Brothers for evaluation .  If small,  Discussed referral to GI for change in bowel habits.

## 2017-10-25 NOTE — Assessment & Plan Note (Signed)
She has no signs or symptoms of celiac disease, SLE or nutritional deficiencies.

## 2017-10-25 NOTE — Assessment & Plan Note (Signed)
Annual comprehensive preventive exam was done as well as an evaluation and management of chronic conditions .  During the course of the visit the patient was educated and counseled about appropriate screening and preventive services including :  diabetes screening, lipid analysis with projected  10 year  risk for CAD , nutrition counseling, breast, cervical and colorectal cancer screening, and recommended immunizations.  Printed recommendations for health maintenance screenings was given 

## 2017-10-26 LAB — CYTOLOGY - PAP
Diagnosis: NEGATIVE
HPV: NOT DETECTED

## 2017-10-31 ENCOUNTER — Encounter (INDEPENDENT_AMBULATORY_CARE_PROVIDER_SITE_OTHER): Payer: Self-pay

## 2017-11-01 ENCOUNTER — Telehealth: Payer: Self-pay

## 2017-11-01 NOTE — Telephone Encounter (Signed)
Copied from [place] [id number]. Topic: Referral - Question >> Nov 01, 2017  3:41 PM Conception Chancy, NT wrote: Reason for CRM: Arrie Aran is calling from Dr. Garwin Brothers office and received a referral for this patient with no documents. Please advise.   Fax# [phone] attention Dawn  Cb# [phone] ext [phone] Last office note and demographic sheet faxed attn: Dawn to fax number listed.

## 2017-11-03 ENCOUNTER — Ambulatory Visit
Admission: RE | Admit: 2017-11-03 | Discharge: 2017-11-03 | Disposition: A | Discharge status: Home / Self Care | Payer: BLUE CROSS/BLUE SHIELD | Source: Ambulatory Visit | Attending: Internal Medicine | Admitting: Internal Medicine

## 2017-11-03 DIAGNOSIS — R911 Solitary pulmonary nodule: Secondary | ICD-10-CM | POA: Insufficient documentation

## 2017-11-03 DIAGNOSIS — R918 Other nonspecific abnormal finding of lung field: Secondary | ICD-10-CM | POA: Diagnosis not present

## 2017-12-02 ENCOUNTER — Ambulatory Visit
Admission: RE | Admit: 2017-12-02 | Discharge: 2017-12-02 | Disposition: A | Discharge status: Home / Self Care | Payer: BLUE CROSS/BLUE SHIELD | Source: Ambulatory Visit | Attending: Internal Medicine | Admitting: Internal Medicine

## 2017-12-02 DIAGNOSIS — Z1231 Encounter for screening mammogram for malignant neoplasm of breast: Secondary | ICD-10-CM | POA: Insufficient documentation

## 2017-12-05 ENCOUNTER — Other Ambulatory Visit: Payer: Self-pay | Admitting: Internal Medicine

## 2017-12-05 ENCOUNTER — Encounter: Payer: Self-pay | Admitting: Internal Medicine

## 2017-12-05 DIAGNOSIS — N6489 Other specified disorders of breast: Secondary | ICD-10-CM

## 2017-12-05 DIAGNOSIS — R928 Other abnormal and inconclusive findings on diagnostic imaging of breast: Secondary | ICD-10-CM | POA: Insufficient documentation

## 2017-12-09 ENCOUNTER — Ambulatory Visit
Admission: RE | Admit: 2017-12-09 | Discharge: 2017-12-09 | Disposition: A | Discharge status: Home / Self Care | Payer: BLUE CROSS/BLUE SHIELD | Source: Ambulatory Visit | Attending: Internal Medicine | Admitting: Internal Medicine

## 2017-12-09 DIAGNOSIS — R928 Other abnormal and inconclusive findings on diagnostic imaging of breast: Secondary | ICD-10-CM | POA: Diagnosis not present

## 2017-12-09 DIAGNOSIS — N6489 Other specified disorders of breast: Secondary | ICD-10-CM | POA: Diagnosis present

## 2018-09-15 ENCOUNTER — Telehealth: Payer: Self-pay | Admitting: Cardiovascular Disease

## 2018-09-15 NOTE — Telephone Encounter (Signed)
Virtual Visit Pre-Appointment Phone Call  "(Name), I am calling you today to discuss your upcoming appointment. We are currently trying to limit exposure to the virus that causes COVID-19 by seeing patients at home rather than in the office."  1. "What is the BEST phone number to call the day of the visit?" - include this in appointment notes  2. Do you have or have access to (through a family member/friend) a smartphone with video capability that we can use for your visit?" a. If yes - list this number in appt notes as cell (if different from BEST phone #) and list the appointment type as a VIDEO visit in appointment notes b. If no - list the appointment type as a PHONE visit in appointment notes  3. Confirm consent - "In the setting of the current Covid19 crisis, you are scheduled for a (phone or video) visit with your provider on (date) at (time).  Just as we do with many in-office visits, in order for you to participate in this visit, we must obtain consent.  If you'd like, I can send this to your mychart (if signed up) or email for you to review.  Otherwise, I can obtain your verbal consent now.  All virtual visits are billed to your insurance company just like a normal visit would be.  By agreeing to a virtual visit, we'd like you to understand that the technology does not allow for your provider to perform an examination, and thus may limit your provider's ability to fully assess your condition. If your provider identifies any concerns that need to be evaluated in person, we will make arrangements to do so.  Finally, though the technology is pretty good, we cannot assure that it will always work on either your or our end, and in the setting of a video visit, we may have to convert it to a phone-only visit.  In either situation, we cannot ensure that we have a secure connection.  Are you willing to proceed?" STAFF: Did the patient verbally acknowledge consent to telehealth visit? Document  YES/NO here: Yes   4. Advise patient to be prepared - "Two hours prior to your appointment, go ahead and check your blood pressure, pulse, oxygen saturation, and your weight (if you have the equipment to check those) and write them all down. When your visit starts, your provider will ask you for this information. If you have an Apple Watch or Kardia device, please plan to have heart rate information ready on the day of your appointment. Please have a pen and paper handy nearby the day of the visit as well."  5. Give patient instructions for MyChart download to smartphone OR Doximity/Doxy.me as below if video visit (depending on what platform provider is using)  6. Inform patient they will receive a phone call 15 minutes prior to their appointment time (may be from unknown caller ID) so they should be prepared to answer    TELEPHONE CALL NOTE  Kristi Coleman has been deemed a candidate for a follow-up tele-health visit to limit community exposure during the Covid-19 pandemic. I spoke with the patient via phone to ensure availability of phone/video source, confirm preferred email & phone number, and discuss instructions and expectations.  I reminded Kristi Coleman to be prepared with any vital sign and/or heart rhythm information that could potentially be obtained via home monitoring, at the time of her visit. I reminded Kristi Coleman to expect a phone call prior  to her visit.  Caryl Pina Gerringer 09/15/2018 11:19 AM   INSTRUCTIONS FOR DOWNLOADING THE MYCHART APP TO SMARTPHONE  - The patient must first make sure to have activated MyChart and know their login information - If Apple, go to [employer] and type in MyChart in the search bar and download the app. If Android, ask patient to go to [employer] and type in [place] in the search bar and download the app. The app is free but as with any other app downloads, their phone may require them to verify saved payment information or Apple/Android  password.  - The patient will need to then log into the app with their MyChart username and password, and select [facility] as their healthcare provider to link the account. When it is time for your visit, go to the MyChart app, find appointments, and click Begin Video Visit. Be sure to Select Allow for your device to access the Microphone and Camera for your visit. You will then be connected, and your provider will be with you shortly.  **If they have any issues connecting, or need assistance please contact MyChart service desk [phone])**  **If using a computer, in order to ensure the best quality for their visit they will need to use either of the following Internet Browsers: [employer], or Google Chrome**  IF USING DOXIMITY or DOXY.ME - The patient will receive a link just prior to their visit by text.     FULL LENGTH CONSENT FOR TELE-HEALTH VISIT   I hereby voluntarily request, consent and authorize [facility] and its employed or contracted physicians, physician assistants, nurse practitioners or other licensed health care professionals (the Practitioner), to provide me with telemedicine health care services (the Services") as deemed necessary by the treating Practitioner. I acknowledge and consent to receive the Services by the Practitioner via telemedicine. I understand that the telemedicine visit will involve communicating with the Practitioner through live audiovisual communication technology and the disclosure of certain medical information by electronic transmission. I acknowledge that I have been given the opportunity to request an in-person assessment or other available alternative prior to the telemedicine visit and am voluntarily participating in the telemedicine visit.  I understand that I have the right to withhold or withdraw my consent to the use of telemedicine in the course of my care at any time, without affecting my right to future care or treatment,  and that the Practitioner or I may terminate the telemedicine visit at any time. I understand that I have the right to inspect all information obtained and/or recorded in the course of the telemedicine visit and may receive copies of available information for a reasonable fee.  I understand that some of the potential risks of receiving the Services via telemedicine include:   Delay or interruption in medical evaluation due to technological equipment failure or disruption;  Information transmitted may not be sufficient (e.g. poor resolution of images) to allow for appropriate medical decision making by the Practitioner; and/or   In rare instances, security protocols could fail, causing a breach of personal health information.  Furthermore, I acknowledge that it is my responsibility to provide information about my medical history, conditions and care that is complete and accurate to the best of my ability. I acknowledge that Practitioner's advice, recommendations, and/or decision may be based on factors not within their control, such as incomplete or inaccurate data provided by me or distortions of diagnostic images or specimens that may result from electronic transmissions. I  understand that the practice of medicine is not an exact science and that Practitioner makes no warranties or guarantees regarding treatment outcomes. I acknowledge that I will receive a copy of this consent concurrently upon execution via email to the email address I last provided but may also request a printed copy by calling the office of [facility].    I understand that my insurance will be billed for this visit.   I have read or had this consent read to me.  I understand the contents of this consent, which adequately explains the benefits and risks of the Services being provided via telemedicine.   I have been provided ample opportunity to ask questions regarding this consent and the Services and have had my questions  answered to my satisfaction.  I give my informed consent for the services to be provided through the use of telemedicine in my medical care  By participating in this telemedicine visit I agree to the above.

## 2018-09-19 NOTE — Progress Notes (Signed)
Virtual Visit via Video Note   This visit type was conducted due to national recommendations for restrictions regarding the COVID-19 Pandemic (e.g. social distancing) in an effort to limit this patient's exposure and mitigate transmission in our community.  Due to her co-morbid illnesses, this patient is at least at moderate risk for complications without adequate follow up.  This format is felt to be most appropriate for this patient at this time.  All issues noted in this document were discussed and addressed.  A limited physical exam was performed with this format.  Please refer to the patient's chart for her consent to telehealth for [facility].   I connected with  Kristi Coleman on 09/20/18 by a video enabled telemedicine application and verified that I am speaking with the correct person using two identifiers. I discussed the limitations of evaluation and management by telemedicine. The patient expressed understanding and agreed to proceed.   Evaluation Performed:  Follow-up visit  Date:  09/20/2018   ID:  Kristi, Coleman May 02, 1956, MRN [id number]  Patient Location:  [address] [place] [address]   Provider location:   [facility], [place] office  PCP:  Crecencio Mc, MD  Cardiologist:  Patsy Baltimore   Chief Complaint:  Prior chest pain   History of Present Illness:    Kristi Coleman is a 63 y.o. female who presents via audio/video conferencing for a telehealth visit today.   The patient does not symptoms concerning for COVID-19 infection (fever, chills, cough, or new SHORTNESS OF BREATH).   Patient has a past medical history of  borderline hyperlipidemia,  Chronic chest pain CT calcium score of 0 in 08/2015 She presents today for f/u of prior episodes of chest pain  In follow-up today she denies any further chest pain symptoms Reports that under stressful situations sometimes has a fullness in her chest Last episode several years ago she  was at the dentist  CT scan chest June 2019 No acute abnormalities Images pulled up and reviewed with her, no significant aortic atherosclerosis and no coronary calcification  Apart from this she has been doing well with no new complaints    Other past medical history pressure in the left chest dating back several years. Feels like a "bubble" in her chest. Often comes and goes without warning, no intervention needed. Typically comes on at rest.  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  No pain on deep inspiration, no pain on palpation    Prior CV studies:   The following studies were reviewed today:    Past Medical History:  Diagnosis Date  . Asthma   . Constipation   . Degenerative arthritis of lumbar spine   . Menopause   . Uterine fibroid    Past Surgical History:  Procedure Laterality Date  . ANAL SPHINCTEROTOMY  2004   [facility]  . EXCISIONAL HEMORRHOIDECTOMY  2005   [facility]     No outpatient medications have been marked as taking for the 09/20/18 encounter (Appointment) with Minna Merritts, MD.     Allergies:   Codeine and Ibuprofen   Social History   Tobacco Use  . Smoking status: Never Smoker  . Smokeless tobacco: Never Used  . Tobacco comment: Tobacco use-no  Substance Use Topics  . Alcohol use: Yes    Alcohol/week:  5.0 standard drinks    Types: 5 Standard drinks or equivalent per week    Comment: 1 drink nightly  . Drug use: No     No current outpatient medications on file prior to visit.   No current facility-administered medications on file prior to visit.      Family Hx: The patient's family history includes Arthritis in her father and mother; Breast cancer in her maternal grandmother and maternal uncle; Cancer in an other family member; Colon cancer in her father; Diabetes in her mother; Heart disease in an other family  member; Hyperlipidemia in her brother and brother; Hypertension in her brother and brother.  ROS:   Please see the history of present illness.    Review of Systems  Constitutional: Negative.   Respiratory: Negative.   Cardiovascular: Negative.   Gastrointestinal: Negative.   Musculoskeletal: Negative.   Neurological: Negative.   Psychiatric/Behavioral: Negative.   All other systems reviewed and are negative.     Labs/Other Tests and Data Reviewed:    Recent Labs: 10/24/2017: ALT 20; BUN 18; Creatinine, Ser 0.96; Hemoglobin 12.2; Platelets 176.0; Potassium 4.1; Sodium 140; TSH 2.31   Recent Lipid Panel Lab Results  Component Value Date/Time   CHOL 209 (H) 10/24/2017 04:00 PM   TRIG 51.0 10/24/2017 04:00 PM   HDL 89.50 10/24/2017 04:00 PM   CHOLHDL 2 10/24/2017 04:00 PM   LDLCALC 110 (H) 10/24/2017 04:00 PM    Wt Readings from Last 3 Encounters:  10/24/17 112 lb (50.8 kg)  08/19/16 112 lb 8 oz (51 kg)  08/28/15 110 lb (49.9 kg)     Exam:    Vital Signs: Vital signs may also be detailed in the HPI There were no vitals taken for this visit.  Wt Readings from Last 3 Encounters:  10/24/17 112 lb (50.8 kg)  08/19/16 112 lb 8 oz (51 kg)  08/28/15 110 lb (49.9 kg)   Temp Readings from Last 3 Encounters:  10/24/17 98.5 F (36.9 C) (Oral)  07/08/15 97.9 F (36.6 C) (Oral)  02/26/15 98.3 F (36.8 C) (Oral)   BP Readings from Last 3 Encounters:  10/24/17 130/84  08/19/16 98/66  08/28/15 120/62   Pulse Readings from Last 3 Encounters:  10/24/17 74  08/19/16 65  08/28/15 60    111/68. Pulse 70 resp 16   Well nourished, well developed female in no acute distress. Constitutional:  oriented to person, place, and time. No distress.  Head: Normocephalic and atraumatic.  Eyes:  no discharge. No scleral icterus.  Neck: Normal range of motion. Neck supple.  Pulmonary/Chest: No audible wheezing, no distress, appears comfortable Musculoskeletal: Normal range of  motion.  no  tenderness or deformity.  Neurological:   Coordination normal. Full exam not performed Skin:  No rash Psychiatric:  normal mood and affect. behavior is normal. Thought content normal.    ASSESSMENT & PLAN:    Chest pain of uncertain etiology Prior history of atypical chest pain Low risk profile, CT scan with no aortic atherosclerosis and no CT coronary calcification No recent episodes concerning for angina No further work-up at this time  COVID-19 Education: The signs and symptoms of COVID-19 were discussed with the patient and how to seek care for testing (follow up with PCP or arrange E-visit).  The importance of social distancing was discussed today.  Patient Risk:   After full review of this patients clinical status, I feel that they are at least moderate risk at this time.  Time:  Today, I have spent 25 minutes with the patient with telehealth technology discussing the cardiac and medical problems/diagnoses detailed above   10 min spent reviewing the chart prior to patient visit today   Medication Adjustments/Labs and Tests Ordered: Current medicines are reviewed at length with the patient today.  Concerns regarding medicines are outlined above.   Tests Ordered: No tests ordered   Medication Changes: No changes made   Disposition: Follow-up as needed   Signed, Ida Rogue, MD  09/20/2018 5:59 PM    [facility] [address] [id number], [place], [place] [address]

## 2018-09-20 ENCOUNTER — Telehealth (INDEPENDENT_AMBULATORY_CARE_PROVIDER_SITE_OTHER): Payer: BLUE CROSS/BLUE SHIELD | Admitting: Cardiovascular Disease

## 2018-09-20 ENCOUNTER — Other Ambulatory Visit: Payer: Self-pay

## 2018-09-20 DIAGNOSIS — R0789 Other chest pain: Secondary | ICD-10-CM

## 2018-09-20 DIAGNOSIS — R079 Chest pain, unspecified: Secondary | ICD-10-CM

## 2018-09-20 NOTE — Patient Instructions (Signed)

## 2018-10-30 ENCOUNTER — Ambulatory Visit (INDEPENDENT_AMBULATORY_CARE_PROVIDER_SITE_OTHER): Payer: BC Managed Care – PPO | Admitting: Internal Medicine

## 2018-10-30 ENCOUNTER — Encounter: Payer: Self-pay | Admitting: Internal Medicine

## 2018-10-30 ENCOUNTER — Other Ambulatory Visit: Payer: Self-pay

## 2018-10-30 VITALS — BP 106/64 | HR 73 | Temp 98.7°F | Resp 14 | Ht 61.0 in | Wt 113.6 lb

## 2018-10-30 DIAGNOSIS — Z800 Family history of malignant neoplasm of digestive organs: Secondary | ICD-10-CM

## 2018-10-30 DIAGNOSIS — Z1239 Encounter for other screening for malignant neoplasm of breast: Secondary | ICD-10-CM

## 2018-10-30 DIAGNOSIS — E559 Vitamin D deficiency, unspecified: Secondary | ICD-10-CM

## 2018-10-30 DIAGNOSIS — Z0000 Encounter for general adult medical examination without abnormal findings: Secondary | ICD-10-CM

## 2018-10-30 DIAGNOSIS — E785 Hyperlipidemia, unspecified: Secondary | ICD-10-CM

## 2018-10-30 DIAGNOSIS — R5383 Other fatigue: Secondary | ICD-10-CM | POA: Diagnosis not present

## 2018-10-30 NOTE — Assessment & Plan Note (Signed)
Referral to [name] for 5 year follow up.

## 2018-10-30 NOTE — Progress Notes (Signed)
Patient ID: Kristi Coleman, female    DOB: 1956/02/13  Age: 63 y.o. MRN: [id number]  The patient is here for annual preventive examination and management of other chronic and acute problems.   The risk factors are reflected in the social history.  Pap normal except for atrophy 2019  Diagnostic  mammogram normal august 2019 colonoscopy 2015 due for 5 yr follow up  For fh of colon cancer . [name]  CT cardiac  score zero    The roster of all physicians providing medical care to patient - is listed in the Snapshot section of the chart.  Activities of daily living:  The patient is 100% independent in all ADLs: dressing, toileting, feeding as well as independent mobility  Home safety : The patient has smoke detectors in the home. They wear seatbelts.  There are secured  firearms at home. There is no violence in the home.   There is no risks for hepatitis, STDs or HIV. There is no history of blood transfusion. They have no travel history to infectious disease endemic areas of the world.  The patient has seen their dentist in the last six month. They have seen their eye doctor in the last year. They admit to slight hearing difficulty with regard to whispered voices and some television programs.  They have deferred audiologic testing in the last year.  They do not  have excessive sun exposure. Discussed the need for sun protection: hats, long sleeves and use of sunscreen if there is significant sun exposure.   Diet: the importance of a healthy diet is discussed. They do have a healthy diet.  The benefits of regular aerobic exercise were discussed. She does not exercise regularly but is physically active daily .   Depression screen: there are no signs or vegative symptoms of depression- irritability, change in appetite, anhedonia, sadness/tearfullness.  Cognitive assessment: the patient manages all their financial and personal affairs and is actively engaged. T  The following portions of the  patient's history were reviewed and updated as appropriate: allergies, current medications, past family history, past medical history,  past surgical history, past social history  and problem list.  Visual acuity was not assessed per patient preference since she has regular follow up with her ophthalmologist. Hearing and body mass index were assessed and reviewed.   During the course of the visit the patient was educated and counseled about appropriate screening and preventive services including : fall prevention , diabetes screening, nutrition counseling, colorectal cancer screening, and recommended immunizations.    CC: The primary encounter diagnosis was Fatigue, unspecified type. Diagnoses of Vitamin D deficiency, Hyperlipidemia LDL goal <100, Breast cancer screening, Encounter for preventive health examination, and Family history of colon cancer requiring screening colonoscopy were also pertinent to this visit.  History Kristi Coleman has a past medical history of Asthma, Constipation, Degenerative arthritis of lumbar spine, Menopause, and Uterine fibroid.   She has a past surgical history that includes Excisional hemorrhoidectomy (2005) and Anal sphincterotomy (2004).   Her family history includes Arthritis in her father and mother; Breast cancer in her maternal grandmother and maternal uncle; Cancer in an other family member; Colon cancer in her father; Diabetes in her mother; Heart disease in an other family member; Hyperlipidemia in her brother and brother; Hypertension in her brother and brother.She reports that she has never smoked. She has never used smokeless tobacco. She reports current alcohol use of about 5.0 standard drinks of alcohol per week. She reports that she does not  use drugs.  No outpatient medications prior to visit.   No facility-administered medications prior to visit.     Review of Systems   Patient denies headache, fevers, malaise, unintentional weight loss, skin rash, eye  pain, sinus congestion and sinus pain, sore throat, dysphagia,  hemoptysis , cough, dyspnea, wheezing, chest pain, palpitations, orthopnea, edema, abdominal pain, nausea, melena, diarrhea, constipation, flank pain, dysuria, hematuria, urinary  Frequency, nocturia, numbness, tingling, seizures,  Focal weakness, Loss of consciousness,  Tremor, insomnia, depression, anxiety, and suicidal ideation.     Objective:  BP 106/64 (BP Location: Left Arm, Patient Position: Sitting, Cuff Size: Normal)   Pulse 73   Temp 98.7 F (37.1 C) (Oral)   Resp 14   Ht 5\' 1"  (1.549 m)   Wt 113 lb 9.6 oz (51.5 kg)   SpO2 98%   BMI 21.46 kg/m   Physical Exam  General appearance: alert, cooperative and appears stated age Head: Normocephalic, without obvious abnormality, atraumatic Eyes: conjunctivae/corneas clear. PERRL, EOM's intact. Fundi benign. Ears: normal TM's and external ear canals both ears Nose: Nares normal. Septum midline. Mucosa normal. No drainage or sinus tenderness. Throat: lips, mucosa, and tongue normal; teeth and gums normal Neck: no adenopathy, no carotid bruit, no JVD, supple, symmetrical, trachea midline and thyroid not enlarged, symmetric, no tenderness/mass/nodules Lungs: clear to auscultation bilaterally Breasts: normal appearance, no masses or tenderness Heart: regular rate and rhythm, S1, S2 normal, no murmur, click, rub or gallop Abdomen: soft, non-tender; bowel sounds normal; no masses,  no organomegaly Extremities: extremities normal, atraumatic, no cyanosis or edema Pulses: 2+ and symmetric Skin: Skin color, texture, turgor normal. No rashes or lesions Neurologic: Alert and oriented X 3, normal strength and tone. Normal symmetric reflexes. Normal coordination and gait.     Assessment & Plan:   Problem List Items Addressed This Visit    Encounter for preventive health examination    age appropriate education and counseling updated, referrals for preventative services and  immunizations addressed, dietary and smoking counseling addressed, most recent labs reviewed.  I have personally reviewed and have noted:  1) the patient's medical and social history 2) The pt's use of alcohol, tobacco, and illicit drugs 3) The patient's current medications and supplements 4) Functional ability including ADL's, fall risk, home safety risk, hearing and visual impairment 5) Diet and physical activities 6) Evidence for depression or mood disorder 7) The patient's height, weight, and BMI have been recorded in the chart  I have made referrals, and provided counseling and education based on review of the above      Family history of colon cancer requiring screening colonoscopy    Referral to [name] for 5 year follow up.       Relevant Orders   Ambulatory referral to Gastroenterology    Other Visit Diagnoses    Fatigue, unspecified type    -  Primary   Relevant Orders   Comprehensive metabolic panel   TSH   ABO/Rh   CBC with Differential/Platelet   Vitamin D deficiency       Relevant Orders   VITAMIN D 25 Hydroxy (Vit-D Deficiency, Fractures)   Hyperlipidemia LDL goal <100       Relevant Orders   Lipid panel   Breast cancer screening       Relevant Orders   MM 3D SCREEN BREAST BILATERAL      [facility] does not currently have medications on file.  No orders of the defined types were placed in  this encounter.   There are no discontinued medications.  Follow-up: No follow-ups on file.   Crecencio Mc, MD

## 2018-10-30 NOTE — Assessment & Plan Note (Signed)

## 2018-10-30 NOTE — Patient Instructions (Signed)
Your annual mammogram has been ordered.  You are encouraged (required) to call to make your appointment at [facility]   I will order the referral to Dr Vira Agar and the DEXA scan   Next (and final!) mammogram is due in  2022   Health Maintenance for Postmenopausal Women Menopause is a normal process in which your reproductive ability comes to an end. This process happens gradually over a span of months to years, usually between the ages of 68 and 58. Menopause is complete when you have missed 12 consecutive menstrual periods. It is important to talk with your health care provider about some of the most common conditions that affect postmenopausal women, such as heart disease, cancer, and bone loss (osteoporosis). Adopting a healthy lifestyle and getting preventive care can help to promote your health and wellness. Those actions can also lower your chances of developing some of these common conditions. What should I know about menopause? During menopause, you may experience a number of symptoms, such as:  Moderate-to-severe hot flashes.  Night sweats.  Decrease in sex drive.  Mood swings.  Headaches.  Tiredness.  Irritability.  Memory problems.  Insomnia. Choosing to treat or not to treat menopausal changes is an individual decision that you make with your health care provider. What should I know about hormone replacement therapy and supplements? Hormone therapy products are effective for treating symptoms that are associated with menopause, such as hot flashes and night sweats. Hormone replacement carries certain risks, especially as you become older. If you are thinking about using estrogen or estrogen with progestin treatments, discuss the benefits and risks with your health care provider. What should I know about heart disease and stroke? Heart disease, heart attack, and stroke become more likely as you age. This may be due, in part, to the hormonal changes that your  body experiences during menopause. These can affect how your body processes dietary fats, triglycerides, and cholesterol. Heart attack and stroke are both medical emergencies. There are many things that you can do to help prevent heart disease and stroke:  Have your blood pressure checked at least every 1-2 years. High blood pressure causes heart disease and increases the risk of stroke.  If you are 25-53 years old, ask your health care provider if you should take aspirin to prevent a heart attack or a stroke.  Do not use any tobacco products, including cigarettes, chewing tobacco, or electronic cigarettes. If you need help quitting, ask your health care provider.  It is important to eat a healthy diet and maintain a healthy weight. ? Be sure to include plenty of vegetables, fruits, low-fat dairy products, and lean protein. ? Avoid eating foods that are high in solid fats, added sugars, or salt (sodium).  Get regular exercise. This is one of the most important things that you can do for your health. ? Try to exercise for at least 150 minutes each week. The type of exercise that you do should increase your heart rate and make you sweat. This is known as moderate-intensity exercise. ? Try to do strengthening exercises at least twice each week. Do these in addition to the moderate-intensity exercise.  Know your numbers.Ask your health care provider to check your cholesterol and your blood glucose. Continue to have your blood tested as directed by your health care provider.  What should I know about cancer screening? There are several types of cancer. Take the following steps to reduce your risk and to catch any cancer  development as early as possible. Breast Cancer  Practice breast self-awareness. ? This means understanding how your breasts normally appear and feel. ? It also means doing regular breast self-exams. Let your health care provider know about any changes, no matter how small.  If  you are 47 or older, have a clinician do a breast exam (clinical breast exam or CBE) every year. Depending on your age, family history, and medical history, it may be recommended that you also have a yearly breast X-ray (mammogram).  If you have a family history of breast cancer, talk with your health care provider about genetic screening.  If you are at high risk for breast cancer, talk with your health care provider about having an MRI and a mammogram every year.  Breast cancer (BRCA) gene test is recommended for women who have family members with BRCA-related cancers. Results of the assessment will determine the need for genetic counseling and BRCA1 and for BRCA2 testing. BRCA-related cancers include these types: ? Breast. This occurs in males or females. ? Ovarian. ? Tubal. This may also be called fallopian tube cancer. ? Cancer of the abdominal or pelvic lining (peritoneal cancer). ? Prostate. ? Pancreatic. Cervical, Uterine, and Ovarian Cancer Your health care provider may recommend that you be screened regularly for cancer of the pelvic organs. These include your ovaries, uterus, and vagina. This screening involves a pelvic exam, which includes checking for microscopic changes to the surface of your cervix (Pap test).  For women ages 21-65, health care providers may recommend a pelvic exam and a Pap test every three years. For women ages 36-65, they may recommend the Pap test and pelvic exam, combined with testing for human papilloma virus (HPV), every five years. Some types of HPV increase your risk of cervical cancer. Testing for HPV may also be done on women of any age who have unclear Pap test results.  Other health care providers may not recommend any screening for nonpregnant women who are considered low risk for pelvic cancer and have no symptoms. Ask your health care provider if a screening pelvic exam is right for you.  If you have had past treatment for cervical cancer or a  condition that could lead to cancer, you need Pap tests and screening for cancer for at least 20 years after your treatment. If Pap tests have been discontinued for you, your risk factors (such as having a new sexual partner) need to be reassessed to determine if you should start having screenings again. Some women have medical problems that increase the chance of getting cervical cancer. In these cases, your health care provider may recommend that you have screening and Pap tests more often.  If you have a family history of uterine cancer or ovarian cancer, talk with your health care provider about genetic screening.  If you have vaginal bleeding after reaching menopause, tell your health care provider.  There are currently no reliable tests available to screen for ovarian cancer. Lung Cancer Lung cancer screening is recommended for adults 28-53 years old who are at high risk for lung cancer because of a history of smoking. A yearly low-dose CT scan of the lungs is recommended if you:  Currently smoke.  Have a history of at least 30 pack-years of smoking and you currently smoke or have quit within the past 15 years. A pack-year is smoking an average of one pack of cigarettes per day for one year. Yearly screening should:  Continue until it has been 15  years since you quit.  Stop if you develop a health problem that would prevent you from having lung cancer treatment. Colorectal Cancer  This type of cancer can be detected and can often be prevented.  Routine colorectal cancer screening usually begins at age 65 and continues through age 88.  If you have risk factors for colon cancer, your health care provider may recommend that you be screened at an earlier age.  If you have a family history of colorectal cancer, talk with your health care provider about genetic screening.  Your health care provider may also recommend using home test kits to check for hidden blood in your stool.  A small  camera at the end of a tube can be used to examine your colon directly (sigmoidoscopy or colonoscopy). This is done to check for the earliest forms of colorectal cancer.  Direct examination of the colon should be repeated every 5-10 years until age 33. However, if early forms of precancerous polyps or small growths are found or if you have a family history or genetic risk for colorectal cancer, you may need to be screened more often. Skin Cancer  Check your skin from head to toe regularly.  Monitor any moles. Be sure to tell your health care provider: ? About any new moles or changes in moles, especially if there is a change in a mole's shape or color. ? If you have a mole that is larger than the size of a pencil eraser.  If any of your family members has a history of skin cancer, especially at a young age, talk with your health care provider about genetic screening.  Always use sunscreen. Apply sunscreen liberally and repeatedly throughout the day.  Whenever you are outside, protect yourself by wearing long sleeves, pants, a wide-brimmed hat, and sunglasses. What should I know about osteoporosis? Osteoporosis is a condition in which bone destruction happens more quickly than new bone creation. After menopause, you may be at an increased risk for osteoporosis. To help prevent osteoporosis or the bone fractures that can happen because of osteoporosis, the following is recommended:  If you are 6-53 years old, get at least 1,000 mg of calcium and at least 600 mg of vitamin D per day.  If you are older than age 76 but younger than age 79, get at least 1,200 mg of calcium and at least 600 mg of vitamin D per day.  If you are older than age 25, get at least 1,200 mg of calcium and at least 800 mg of vitamin D per day. Smoking and excessive alcohol intake increase the risk of osteoporosis. Eat foods that are rich in calcium and vitamin D, and do weight-bearing exercises several times each week as  directed by your health care provider. What should I know about how menopause affects my mental health? Depression may occur at any age, but it is more common as you become older. Common symptoms of depression include:  Low or sad mood.  Changes in sleep patterns.  Changes in appetite or eating patterns.  Feeling an overall lack of motivation or enjoyment of activities that you previously enjoyed.  Frequent crying spells. Talk with your health care provider if you think that you are experiencing depression. What should I know about immunizations? It is important that you get and maintain your immunizations. These include:  Tetanus, diphtheria, and pertussis (Tdap) booster vaccine.  Influenza every year before the flu season begins.  Pneumonia vaccine.  Shingles vaccine. Your health  care provider may also recommend other immunizations. This information is not intended to replace advice given to you by your health care provider. Make sure you discuss any questions you have with your health care provider. Document Released: 06/18/2005 Document Revised: 11/14/2015 Document Reviewed: 01/28/2015 Elsevier Interactive Patient Education  2019 [employer].

## 2018-11-14 ENCOUNTER — Encounter: Payer: Self-pay | Admitting: Internal Medicine

## 2018-12-26 LAB — HM MAMMOGRAPHY

## 2018-12-27 ENCOUNTER — Ambulatory Visit
Admission: RE | Admit: 2018-12-27 | Discharge: 2018-12-27 | Disposition: A | Discharge status: Home / Self Care | Payer: BC Managed Care – PPO | Source: Ambulatory Visit | Attending: Internal Medicine | Admitting: Internal Medicine

## 2018-12-27 DIAGNOSIS — Z1231 Encounter for screening mammogram for malignant neoplasm of breast: Secondary | ICD-10-CM | POA: Insufficient documentation

## 2018-12-27 DIAGNOSIS — Z1239 Encounter for other screening for malignant neoplasm of breast: Secondary | ICD-10-CM

## 2019-05-25 ENCOUNTER — Other Ambulatory Visit: Payer: Self-pay | Admitting: Internal Medicine

## 2019-05-25 MED ORDER — AZITHROMYCIN 500 MG PO TABS: 500.0000 mg | ORAL_TABLET | Freq: Every day | ORAL | 0 refills | Status: DC

## 2019-05-25 MED ORDER — PREDNISONE 10 MG PO TABS: ORAL_TABLET | ORAL | 0 refills | Status: DC

## 2019-06-25 ENCOUNTER — Other Ambulatory Visit: Payer: Self-pay

## 2019-06-25 ENCOUNTER — Ambulatory Visit: Payer: BC Managed Care – PPO | Attending: Internal Medicine

## 2019-06-25 DIAGNOSIS — Z23 Encounter for immunization: Secondary | ICD-10-CM

## 2019-06-25 NOTE — Progress Notes (Signed)
   Covid-19 Vaccination Clinic  Name:  Kristi Coleman    MRN: [id number] DOB: January 04, 1956  06/25/2019  Kristi Coleman was observed post Covid-19 immunization for 15 minutes without incidence. She was provided with Vaccine Information Sheet and instruction to access the V-Safe system.   Kristi Coleman was instructed to call 911 with any severe reactions post vaccine: Marland Kitchen Difficulty breathing  . Swelling of your face and throat  . A fast heartbeat  . A bad rash all over your body  . Dizziness and weakness    Immunizations Administered    Name Date Dose VIS Date Route   Pfizer COVID-19 Vaccine 06/25/2019 10:53 AM 0.3 mL 04/20/2019 Intramuscular   Manufacturer: [place]   Lot: [id number]   [place]: [id number]

## 2019-06-27 ENCOUNTER — Ambulatory Visit: Payer: BC Managed Care – PPO

## 2019-07-16 ENCOUNTER — Encounter: Payer: Self-pay | Admitting: Cardiovascular Disease

## 2019-07-16 ENCOUNTER — Ambulatory Visit: Payer: BC Managed Care – PPO | Admitting: Cardiovascular Disease

## 2019-07-16 ENCOUNTER — Other Ambulatory Visit: Payer: Self-pay

## 2019-07-16 ENCOUNTER — Ambulatory Visit (INDEPENDENT_AMBULATORY_CARE_PROVIDER_SITE_OTHER): Payer: BC Managed Care – PPO | Admitting: Cardiovascular Disease

## 2019-07-16 VITALS — BP 138/72 | HR 65 | Ht 67.0 in | Wt 112.5 lb

## 2019-07-16 DIAGNOSIS — R079 Chest pain, unspecified: Secondary | ICD-10-CM | POA: Diagnosis not present

## 2019-07-16 NOTE — Patient Instructions (Signed)
Medication Instructions:  No changes  Try naproxen as needed for pain  If you need a refill on your cardiac medications before your next appointment, please call your pharmacy.    Lab work: No new labs needed   If you have labs (blood work) drawn today and your tests are completely normal, you will receive your results only by: Marland Kitchen MyChart Message (if you have MyChart) OR . A paper copy in the mail If you have any lab test that is abnormal or we need to change your treatment, we will call you to review the results.   Testing/Procedures: No new testing needed   Follow-Up: At [facility], you and your health needs are our priority.  As part of our continuing mission to provide you with exceptional heart care, we have created designated Provider Care Teams.  These Care Teams include your primary Cardiologist (physician) and Advanced Practice Providers (APPs -  Physician Assistants and Nurse Practitioners) who all work together to provide you with the care you need, when you need it.  . You will need a follow up appointment as needed   . Providers on your designated Care Team:   . Murray Hodgkins, NP . Christell Faith, PA-C . Marrianne Mood, PA-C  Any Other Special Instructions Will Be Listed Below (If Applicable).  For educational health videos Log in to : [email or web address] Or : [email or web address], password : [email or web address]

## 2019-07-16 NOTE — Progress Notes (Signed)
Date:  07/16/2019   ID:  Kristi Coleman, Kristi Coleman [birth year], MRN [id number]  Patient Location:  [address] [place] [address]   Provider location:   [facility], [place] office  PCP:  Crecencio Mc, MD  Cardiologist:  Arvid Right [facility]   Chief Complaint  Patient presents with  . OTHER    Chest pain . Meds reviewed verbally with pt.     History of Present Illness:    Kristi Coleman is a 64 y.o. 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  EKG personally reviewed by myself on todays visit NSR rate 65 no ST or T wave changes  Other past medical history pressure in the left chest dating back several years. Feels like a "bubble" in her chest. Often comes and goes without warning, no intervention needed. Typically comes on at rest.  at the dentist when she developed symptoms in the left chest   Prior CV studies:   The following studies were reviewed today:    Past Medical History:  Diagnosis Date  . Asthma   . Constipation   . Degenerative arthritis of lumbar spine   . Menopause   . Uterine fibroid    Past Surgical History:  Procedure Laterality Date  . ANAL SPHINCTEROTOMY  2004   [place]  . EXCISIONAL HEMORRHOIDECTOMY  2005   [place]     No outpatient medications have been marked as taking for the 07/16/19 encounter (Office  Visit) with Minna Merritts, MD.     Allergies:   Codeine and Ibuprofen   Social History   Tobacco Use  . Smoking status: Never Smoker  . Smokeless tobacco: Never Used  . Tobacco comment: Tobacco use-no  Substance Use Topics  . Alcohol use: Yes    Alcohol/week: 5.0 standard drinks    Types: 5 Standard drinks or equivalent per week    Comment: 1 drink nightly  . Drug use: No     No current outpatient medications on file prior to visit.   No current facility-administered medications on file prior to visit.     Family Hx: The patient's family history includes Arthritis in her father and mother; Breast cancer in her maternal grandmother and maternal uncle; Cancer in an other family member; Colon cancer in her father; Diabetes in her mother; Heart disease in an other family member; Hyperlipidemia in her brother and brother; Hypertension in her brother and brother.  ROS:   Please see the history of present illness.    Review of Systems  Constitutional: Negative.   Respiratory: Negative.   Cardiovascular: Negative.   Gastrointestinal: Negative.   Musculoskeletal: Negative.   Neurological: Negative.   Psychiatric/Behavioral: Negative.   All other systems reviewed and are negative.     Labs/Other Tests and Data Reviewed:    Recent Labs: No results found  for requested labs within last 8760 hours.   Recent Lipid Panel Lab Results  Component Value Date/Time   CHOL 209 (H) 10/24/2017 04:00 PM   TRIG 51.0 10/24/2017 04:00 PM   HDL 89.50 10/24/2017 04:00 PM   CHOLHDL 2 10/24/2017 04:00 PM   LDLCALC 110 (H) 10/24/2017 04:00 PM    Wt Readings from Last 3 Encounters:  07/16/19 112 lb 8 oz (51 kg)  10/30/18 113 lb 9.6 oz (51.5 kg)  10/24/17 112 lb (50.8 kg)     Exam:    BP 138/72 (BP Location: Left Arm, Patient Position: Sitting, Cuff Size: Normal)   Pulse 65   Ht 5\' 7"  (1.702 m)   Wt 112 lb 8 oz (51 kg)   SpO2 99%   BMI 17.62 kg/m  Constitutional:  oriented to  person, place, and time. No distress.  HENT:  Head: Grossly normal Eyes:  no discharge. No scleral icterus.  Neck: No JVD, no carotid bruits  Cardiovascular: Regular rate and rhythm, no murmurs appreciated Pulmonary/Chest: Clear to auscultation bilaterally, no wheezes or rails Abdominal: Soft.  no distension.  no tenderness.  Musculoskeletal: Normal range of motion Neurological:  normal muscle tone. Coordination normal. No atrophy Skin: Skin warm and dry Psychiatric: normal affect, pleasant   ASSESSMENT & PLAN:    Chest pain of uncertain etiology Atypical chest pain, discomfort with certain maneuvers such as picking up logs, stirring things Prior CT scan with no aortic atherosclerosis and no CT coronary calcification --Likely musculoskeletal injury starting 7 to 9 days ago Recommended rest, icing, NSAIDs If symptoms do not improve would suggest x-ray Could consider chiropractic     Total encounter time more than 25 minutes  Greater than 50% was spent in counseling and coordination of care with the patient  Disposition: Follow-up as needed   Signed, Ida Rogue, MD  07/16/2019 2:29 PM    [place] Office [address], [place], [place] [address]

## 2019-07-25 ENCOUNTER — Ambulatory Visit: Payer: BC Managed Care – PPO | Attending: Internal Medicine

## 2019-07-25 DIAGNOSIS — Z23 Encounter for immunization: Secondary | ICD-10-CM

## 2019-07-25 NOTE — Progress Notes (Signed)
   Covid-19 Vaccination Clinic  Name:  WYONA BALES    MRN: [id number] DOB: January 02, 1956  07/25/2019  Ms. Reynosa was observed post Covid-19 immunization for 15 minutes without incident. She was provided with Vaccine Information Sheet and instruction to access the V-Safe system.   Ms. Twait was instructed to call 911 with any severe reactions post vaccine: Marland Kitchen Difficulty breathing  . Swelling of face and throat  . A fast heartbeat  . A bad rash all over body  . Dizziness and weakness   Immunizations Administered    Name Date Dose VIS Date Route   Pfizer COVID-19 Vaccine 07/25/2019  8:54 AM 0.3 mL 04/20/2019 Intramuscular   Manufacturer: [place]   Lot: [id number]   [place]: [id number]

## 2019-09-12 ENCOUNTER — Other Ambulatory Visit: Payer: Self-pay | Admitting: Internal Medicine

## 2019-09-12 DIAGNOSIS — Z800 Family history of malignant neoplasm of digestive organs: Secondary | ICD-10-CM

## 2019-09-12 NOTE — Assessment & Plan Note (Signed)
Referral In progress for 5 year follow up

## 2019-09-13 ENCOUNTER — Telehealth: Payer: Self-pay | Admitting: Internal Medicine

## 2019-09-13 NOTE — Telephone Encounter (Signed)
Pt will call back with ins information.

## 2019-09-17 ENCOUNTER — Telehealth (INDEPENDENT_AMBULATORY_CARE_PROVIDER_SITE_OTHER): Payer: Self-pay | Admitting: Gastroenterology

## 2019-09-17 ENCOUNTER — Other Ambulatory Visit: Payer: Self-pay

## 2019-09-17 DIAGNOSIS — Z800 Family history of malignant neoplasm of digestive organs: Secondary | ICD-10-CM

## 2019-09-17 MED ORDER — NA SULFATE-K SULFATE-MG SULF 17.5-3.13-1.6 GM/177ML PO SOLN: 1.0000 | Freq: Once | ORAL | 0 refills | Status: AC

## 2019-09-17 NOTE — Progress Notes (Signed)
Gastroenterology Pre-Procedure Review  Request Date: 09/27/19 Requesting Physician: Dr. Allen Norris  PATIENT REVIEW QUESTIONS: The patient responded to the following health history questions as indicated:    1. Are you having any GI issues? yes (hemorrhoids) 2. Do you have a personal history of Polyps? no 3. Do you have a family history of Colon Cancer or Polyps? yes (father colon cancer) 4. Diabetes Mellitus? no 5. Joint replacements in the past 12 months?no 6. Major health problems in the past 3 months?no 7. Any artificial heart valves, MVP, or defibrillator?no    MEDICATIONS & ALLERGIES:    Patient reports the following regarding taking any anticoagulation/antiplatelet therapy:   Plavix, Coumadin, Eliquis, Xarelto, Lovenox, Pradaxa, Brilinta, or Effient? no Aspirin? no  Patient confirms/reports the following medications:  Current Outpatient Medications  Medication Sig Dispense Refill  . Na Sulfate-K Sulfate-Mg Sulf 17.5-3.13-1.6 GM/177ML SOLN Take 1 kit by mouth once for 1 dose. 354 mL 0   No current facility-administered medications for this visit.    Patient confirms/reports the following allergies:  Allergies  Allergen Reactions  . Codeine Nausea Only  . Ibuprofen Swelling    Orders Placed This Encounter  Procedures  . Procedural/ Surgical Case Request: COLONOSCOPY WITH PROPOFOL    Standing Status:   Standing    Number of Occurrences:   1    Order Specific Question:   Pre-op diagnosis    Answer:   family history of colon cancer    Order Specific Question:   CPT Code    Answer:   [phone]    AUTHORIZATION INFORMATION Primary Insurance: 1D#: Group #:  Secondary Insurance: 1D#: Group #:  SCHEDULE INFORMATION: Date: 09/27/19 Time: Location:[facility]

## 2019-09-27 ENCOUNTER — Encounter: Payer: Self-pay | Admitting: Gastroenterology

## 2019-09-27 ENCOUNTER — Other Ambulatory Visit: Payer: Self-pay

## 2019-10-02 NOTE — Anesthesia Preprocedure Evaluation (Addendum)
Anesthesia Evaluation  Patient identified by MRN, date of birth, ID band Patient awake    Reviewed: Allergy & Precautions, NPO status , Patient's Chart, lab work & pertinent test results, reviewed documented beta blocker date and time   History of Anesthesia Complications Negative for: history of anesthetic complications  Airway Mallampati: II  TM Distance: >3 FB Neck ROM: Full    Dental   Pulmonary asthma ,    breath sounds clear to auscultation       Cardiovascular (-) angina(-) DOE  Rhythm:Regular Rate:Normal     Neuro/Psych    GI/Hepatic neg GERD  ,  Endo/Other    Renal/GU      Musculoskeletal  (+) Arthritis ,   Abdominal   Peds  Hematology   Anesthesia Other Findings   Reproductive/Obstetrics                            Anesthesia Physical Anesthesia Plan  ASA: II  Anesthesia Plan: General   Post-op Pain Management:    Induction: Intravenous  PONV Risk Score and Plan: 3 and Propofol infusion, TIVA and Treatment may vary due to age or medical condition  Airway Management Planned: Natural Airway and Nasal Cannula  Additional Equipment:   Intra-op Plan:   Post-operative Plan:   Informed Consent: I have reviewed the patients History and Physical, chart, labs and discussed the procedure including the risks, benefits and alternatives for the proposed anesthesia with the patient or authorized representative who has indicated his/her understanding and acceptance.       Plan Discussed with: CRNA and Anesthesiologist  Anesthesia Plan Comments:         Anesthesia Quick Evaluation

## 2019-10-03 ENCOUNTER — Other Ambulatory Visit
Admission: RE | Admit: 2019-10-03 | Discharge: 2019-10-03 | Disposition: A | Discharge status: Home / Self Care | Payer: 59 | Source: Ambulatory Visit | Attending: Gastroenterology | Admitting: Gastroenterology

## 2019-10-03 ENCOUNTER — Other Ambulatory Visit: Payer: Self-pay

## 2019-10-03 DIAGNOSIS — Z20822 Contact with and (suspected) exposure to covid-19: Secondary | ICD-10-CM | POA: Insufficient documentation

## 2019-10-03 DIAGNOSIS — Z01812 Encounter for preprocedural laboratory examination: Secondary | ICD-10-CM | POA: Diagnosis not present

## 2019-10-03 LAB — SARS CORONAVIRUS 2 (TAT 6-24 HRS): SARS Coronavirus 2: NEGATIVE

## 2019-10-04 NOTE — Discharge Instructions (Signed)
General Anesthesia, Adult, Care After This sheet gives you information about how to care for yourself after your procedure. Your health care provider may also give you more specific instructions. If you have problems or questions, contact your health care provider. What can I expect after the procedure? After the procedure, the following side effects are common:  Pain or discomfort at the IV site.  Nausea.  Vomiting.  Sore throat.  Trouble concentrating.  Feeling cold or chills.  Weak or tired.  Sleepiness and fatigue.  Soreness and body aches. These side effects can affect parts of the body that were not involved in surgery. Follow these instructions at home:  For at least 24 hours after the procedure:  Have a responsible adult stay with you. It is important to have someone help care for you until you are awake and alert.  Rest as needed.  Do not: ? Participate in activities in which you could fall or become injured. ? Drive. ? Use heavy machinery. ? Drink alcohol. ? Take sleeping pills or medicines that cause drowsiness. ? Make important decisions or sign legal documents. ? Take care of children on your own. Eating and drinking  Follow any instructions from your health care provider about eating or drinking restrictions.  When you feel hungry, start by eating small amounts of foods that are soft and easy to digest (bland), such as toast. Gradually return to your regular diet.  Drink enough fluid to keep your urine pale yellow.  If you vomit, rehydrate by drinking water, juice, or clear broth. General instructions  If you have sleep apnea, surgery and certain medicines can increase your risk for breathing problems. Follow instructions from your health care provider about wearing your sleep device: ? Anytime you are sleeping, including during daytime naps. ? While taking prescription pain medicines, sleeping medicines, or medicines that make you drowsy.  Return to  your normal activities as told by your health care provider. Ask your health care provider what activities are safe for you.  Take over-the-counter and prescription medicines only as told by your health care provider.  If you smoke, do not smoke without supervision.  Keep all follow-up visits as told by your health care provider. This is important. Contact a health care provider if:  You have nausea or vomiting that does not get better with medicine.  You cannot eat or drink without vomiting.  You have pain that does not get better with medicine.  You are unable to pass urine.  You develop a skin rash.  You have a fever.  You have redness around your IV site that gets worse. Get help right away if:  You have difficulty breathing.  You have chest pain.  You have blood in your urine or stool, or you vomit blood. Summary  After the procedure, it is common to have a sore throat or nausea. It is also common to feel tired.  Have a responsible adult stay with you for the first 24 hours after general anesthesia. It is important to have someone help care for you until you are awake and alert.  When you feel hungry, start by eating small amounts of foods that are soft and easy to digest (bland), such as toast. Gradually return to your regular diet.  Drink enough fluid to keep your urine pale yellow.  Return to your normal activities as told by your health care provider. Ask your health care provider what activities are safe for you. This information is not   intended to replace advice given to you by your health care provider. Make sure you discuss any questions you have with your health care provider. Document Revised: 04/29/2017 Document Reviewed: 12/10/2016 Elsevier Patient Education  2020 Elsevier Inc.  

## 2019-10-05 ENCOUNTER — Ambulatory Visit: Payer: 59 | Admitting: Anesthesiology

## 2019-10-05 ENCOUNTER — Encounter: Payer: Self-pay | Admitting: Gastroenterology

## 2019-10-05 ENCOUNTER — Ambulatory Visit
Admission: RE | Admit: 2019-10-05 | Discharge: 2019-10-05 | Disposition: A | Discharge status: Home / Self Care | Payer: 59 | Attending: Gastroenterology | Admitting: Gastroenterology

## 2019-10-05 ENCOUNTER — Other Ambulatory Visit: Payer: Self-pay

## 2019-10-05 ENCOUNTER — Encounter
Admission: RE | Disposition: A | Discharge status: Home / Self Care | Payer: Self-pay | Source: Home / Self Care | Attending: Gastroenterology

## 2019-10-05 DIAGNOSIS — K5730 Diverticulosis of large intestine without perforation or abscess without bleeding: Secondary | ICD-10-CM | POA: Insufficient documentation

## 2019-10-05 DIAGNOSIS — Z800 Family history of malignant neoplasm of digestive organs: Secondary | ICD-10-CM | POA: Diagnosis not present

## 2019-10-05 DIAGNOSIS — Z1211 Encounter for screening for malignant neoplasm of colon: Secondary | ICD-10-CM | POA: Diagnosis not present

## 2019-10-05 DIAGNOSIS — K641 Second degree hemorrhoids: Secondary | ICD-10-CM | POA: Diagnosis not present

## 2019-10-05 DIAGNOSIS — Z885 Allergy status to narcotic agent status: Secondary | ICD-10-CM | POA: Insufficient documentation

## 2019-10-05 DIAGNOSIS — Z886 Allergy status to analgesic agent status: Secondary | ICD-10-CM | POA: Insufficient documentation

## 2019-10-05 HISTORY — DX: Motion sickness, initial encounter: T75.3XXA

## 2019-10-05 HISTORY — DX: Synovial cyst of popliteal space (Baker), right knee: M71.21

## 2019-10-05 HISTORY — PX: COLONOSCOPY WITH PROPOFOL: SHX5780

## 2019-10-05 HISTORY — DX: Family history of other specified conditions: Z84.89

## 2019-10-05 SURGERY — COLONOSCOPY WITH PROPOFOL
Anesthesia: General | Site: Rectum

## 2019-10-05 MED ORDER — ACETAMINOPHEN 10 MG/ML IV SOLN: 1000.0000 mg | Freq: Once | INTRAVENOUS | Status: DC | PRN

## 2019-10-05 MED ORDER — STERILE WATER FOR IRRIGATION IR SOLN
Status: DC | PRN
  Administered 2019-10-05: 1000 mL

## 2019-10-05 MED ORDER — LIDOCAINE HCL (CARDIAC) PF 100 MG/5ML IV SOSY
PREFILLED_SYRINGE | INTRAVENOUS | Status: DC | PRN
  Administered 2019-10-05: 40 mg via INTRAVENOUS

## 2019-10-05 MED ORDER — ONDANSETRON HCL 4 MG/2ML IJ SOLN: 4.0000 mg | Freq: Once | INTRAMUSCULAR | Status: DC | PRN

## 2019-10-05 MED ORDER — PROPOFOL 10 MG/ML IV BOLUS
INTRAVENOUS | Status: DC | PRN
  Administered 2019-10-05: 30 mg via INTRAVENOUS
  Administered 2019-10-05 (×3): 50 mg via INTRAVENOUS

## 2019-10-05 MED ORDER — SODIUM CHLORIDE 0.9 % IV SOLN: INTRAVENOUS | Status: DC

## 2019-10-05 MED ORDER — LACTATED RINGERS IV SOLN: INTRAVENOUS | Status: DC

## 2019-10-05 SURGICAL SUPPLY — 24 items
CLIP HMST 235XBRD CATH ROT (MISCELLANEOUS) IMPLANT
CLIP RESOLUTION 360 11X235 (MISCELLANEOUS)
ELECT REM PT RETURN 9FT ADLT (ELECTROSURGICAL)
ELECTRODE REM PT RTRN 9FT ADLT (ELECTROSURGICAL) IMPLANT
FCP ESCP3.2XJMB 240X2.8X (MISCELLANEOUS)
FORCEPS BIOP RAD 4 LRG CAP 4 (CUTTING FORCEPS) IMPLANT
FORCEPS BIOP RJ4 240 W/NDL (MISCELLANEOUS)
FORCEPS ESCP3.2XJMB 240X2.8X (MISCELLANEOUS) IMPLANT
GOWN CVR UNV OPN BCK APRN NK (MISCELLANEOUS) ×2 IMPLANT
GOWN ISOL THUMB LOOP REG UNIV (MISCELLANEOUS) ×2
INJECTOR VARIJECT VIN23 (MISCELLANEOUS) IMPLANT
KIT DEFENDO VALVE AND CONN (KITS) IMPLANT
KIT ENDO PROCEDURE OLY (KITS) ×2 IMPLANT
MANIFOLD NEPTUNE II (INSTRUMENTS) IMPLANT
MARKER SPOT ENDO TATTOO 5ML (MISCELLANEOUS) IMPLANT
PROBE APC STR FIRE (PROBE) IMPLANT
RETRIEVER NET ROTH 2.5X230 LF (MISCELLANEOUS) IMPLANT
SNARE SHORT THROW 13M SML OVAL (MISCELLANEOUS) IMPLANT
SNARE SHORT THROW 30M LRG OVAL (MISCELLANEOUS) IMPLANT
SNARE SNG USE RND 15MM (INSTRUMENTS) IMPLANT
SPOT EX ENDOSCOPIC TATTOO (MISCELLANEOUS)
TRAP ETRAP POLY (MISCELLANEOUS) IMPLANT
VARIJECT INJECTOR VIN23 (MISCELLANEOUS)
WATER STERILE IRR 250ML POUR (IV SOLUTION) ×2 IMPLANT

## 2019-10-05 NOTE — H&P (Signed)
Kristi Lame, MD [address] [place], [place] [address] Phone:[phone] Fax : [phone]  Primary Care Physician:  Crecencio Mc, MD Primary Gastroenterologist:  Dr. Allen Norris  Pre-Procedure History & Physical: HPI:  Kristi Coleman is a 64 y.o. female is here for an colonoscopy.   Past Medical History:  Diagnosis Date  . Asthma    hx of in past  . Baker's cyst of knee, right   . Constipation   . Degenerative arthritis of lumbar spine   . Family history of adverse reaction to anesthesia    sister and mom experienced nausea  . Menopause   . Motion sickness   . Uterine fibroid     Past Surgical History:  Procedure Laterality Date  . ANAL SPHINCTEROTOMY  2004   [facility]  . EXCISIONAL HEMORRHOIDECTOMY  2005   [facility]    Prior to Admission medications   Not on File    Allergies as of 09/17/2019 - Review Complete 09/17/2019  Allergen Reaction Noted  . Codeine Nausea Only 01/28/2009  . Ibuprofen Swelling 10/17/2013    Family History  Problem Relation Age of Onset  . Cancer Other   . Breast cancer Maternal Uncle   . Heart disease Other        In some men in her family  . Arthritis Mother   . Diabetes Mother   . Arthritis Father   . Colon cancer Father   . Hyperlipidemia Brother   . Hypertension Brother   . Hypertension Brother   . Hyperlipidemia Brother   . Breast cancer Maternal Grandmother     Social History   Socioeconomic History  . Marital status: Single    Spouse name: Not on file  . Number of children: Not on file  . Years of education: Not on file  . Highest education level: Not on file  Occupational History  . Occupation: Full time  Tobacco Use  . Smoking status: Never Smoker  . Smokeless tobacco: Never Used  . Tobacco comment: Tobacco use-no  Substance and Sexual Activity  . Alcohol use: Yes    Alcohol/week: 5.0 standard drinks    Types: 5 Standard drinks or equivalent per week    Comment: 1 drink nightly  . Drug use:  No  . Sexual activity: Not Currently  Other Topics Concern  . Not on file  Social History Narrative   Single.   Pt gets regular exercise.   Social Determinants of Health   Financial Resource Strain:   . Difficulty of Paying Living Expenses:   Food Insecurity:   . Worried About Charity fundraiser in the Last Year:   . Arboriculturist in the Last Year:   Transportation Needs:   . Film/video editor (Medical):   Marland Kitchen Lack of Transportation (Non-Medical):   Physical Activity:   . Days of Exercise per Week:   . Minutes of Exercise per Session:   Stress:   . Feeling of Stress :   Social Connections:   . Frequency of Communication with Friends and Family:   . Frequency of Social Gatherings with Friends and Family:   . Attends Religious Services:   . Active Member of Clubs or Organizations:   . Attends Archivist Meetings:   Marland Kitchen Marital Status:   Intimate Partner Violence:   . Fear of Current or Ex-Partner:   . Emotionally Abused:   Marland Kitchen Physically Abused:   . Sexually Abused:     Review  of Systems: See HPI, otherwise negative ROS  Physical Exam: BP 122/76   Pulse 65   Temp 98.5 F (36.9 C) (Temporal)   Ht 5' 1.5" (1.562 m)   Wt 48.5 kg   SpO2 100%   BMI 19.89 kg/m  General:   Alert,  pleasant and cooperative in NAD Head:  Normocephalic and atraumatic. Neck:  Supple; no masses or thyromegaly. Lungs:  Clear throughout to auscultation.    Heart:  Regular rate and rhythm. Abdomen:  Soft, nontender and nondistended. Normal bowel sounds, without guarding, and without rebound.   Neurologic:  Alert and  oriented x4;  grossly normal neurologically.  Impression/Plan: DARLYS DEBRUYN is here for an colonoscopy to be performed for family history of colon cancer.  Risks, benefits, limitations, and alternatives regarding  colonoscopy have been reviewed with the patient.  Questions have been answered.  All parties agreeable.   Kristi Lame, MD  10/05/2019, 7:23 AM

## 2019-10-05 NOTE — Op Note (Signed)
[facility] Patient Name: Margery Haliburton Procedure Date: 10/05/2019 7:17 AM MRN: [id number] Account #: [id number] Date of Birth: 1956/02/01 Admit Type: Outpatient Age: 64 Room: [facility] OR ROOM 01 Gender: Female Note Status: Finalized Procedure:             Colonoscopy Indications:           Family history of colon cancer in a first-degree                         relative. Providers:             [name] MD, MD Referring MD:          Deborra Medina, MD (Referring MD) Medicines:             Propofol per Anesthesia Complications:         No immediate complications. Procedure:             Pre-Anesthesia Assessment:                        - Prior to the procedure, a History and Physical was                         performed, and patient medications and allergies were                         reviewed. The patient's tolerance of previous                         anesthesia was also reviewed. The risks and benefits                         of the procedure and the sedation options and risks                         were discussed with the patient. All questions were                         answered, and informed consent was obtained. Prior                         Anticoagulants: The patient has taken no previous                         anticoagulant or antiplatelet agents. ASA Grade                         Assessment: II - A patient with mild systemic disease.                         After reviewing the risks and benefits, the patient                         was deemed in satisfactory condition to undergo the                         procedure.  After obtaining informed consent, the colonoscope was                         passed under direct vision. Throughout the procedure,                         the patient's blood pressure, pulse, and oxygen                         saturations were monitored continuously. The                          Colonoscope was introduced through the anus and                         advanced to the the cecum, identified by appendiceal                         orifice and ileocecal valve. The colonoscopy was                         performed without difficulty. The patient tolerated                         the procedure well. The quality of the bowel                         preparation was excellent. Findings:      The perianal and digital rectal examinations were normal.      A few small-mouthed diverticula were found in the sigmoid colon.      Non-bleeding internal hemorrhoids were found during retroflexion. The       hemorrhoids were Grade II (internal hemorrhoids that prolapse but reduce       spontaneously). Impression:            - Diverticulosis in the sigmoid colon.                        - Non-bleeding internal hemorrhoids.                        - No specimens collected. Recommendation:        - Discharge patient to home.                        - Resume previous diet.                        - Continue present medications.                        - Repeat colonoscopy in 5 years for surveillance. Procedure Code(s):     --- Professional ---                        [phone], Colonoscopy, flexible; diagnostic, including                         collection of specimen(s) by brushing or washing, when  performed (separate procedure) Diagnosis Code(s):     --- Professional ---                        Z80.0, Family history of malignant neoplasm of                         digestive organs CPT copyright 2019 American Medical Association. All rights reserved. The codes documented in this report are preliminary and upon coder review may  be revised to meet current compliance requirements. [name] MD, MD 10/05/2019 7:50:29 AM This report has been signed electronically. Number of Addenda: 0 Note Initiated On: 10/05/2019 7:17 AM Scope Withdrawal Time: 0 hours 7 minutes 56 seconds   Total Procedure Duration: 0 hours 12 minutes 8 seconds  Estimated Blood Loss:  Estimated blood loss: none.      [facility]

## 2019-10-05 NOTE — Anesthesia Procedure Notes (Signed)
Date/Time: 10/05/2019 7:34 AM Performed by: Silvana Newness, CRNA Pre-anesthesia Checklist: Patient identified, Emergency Drugs available, Suction available, Patient being monitored and Timeout performed Patient Re-evaluated:Patient Re-evaluated prior to induction Oxygen Delivery Method: Nasal cannula Induction Type: IV induction

## 2019-10-05 NOTE — Transfer of Care (Signed)
Immediate Anesthesia Transfer of Care Note  Patient: Kristi Coleman  Procedure(s) Performed: COLONOSCOPY WITH PROPOFOL (N/A Rectum)  Patient Location: PACU  Anesthesia Type: General  Level of Consciousness: awake, alert  and patient cooperative  Airway and Oxygen Therapy: Patient Spontanous Breathing and Patient connected to supplemental oxygen  Post-op Assessment: Post-op Vital signs reviewed, Patient's Cardiovascular Status Stable, Respiratory Function Stable, Patent Airway and No signs of Nausea or vomiting  Post-op Vital Signs: Reviewed and stable  Complications: No apparent anesthesia complications

## 2019-10-05 NOTE — Anesthesia Postprocedure Evaluation (Signed)
Anesthesia Post Note  Patient: Kristi Coleman  Procedure(s) Performed: COLONOSCOPY WITH PROPOFOL (N/A Rectum)     Patient location during evaluation: PACU Anesthesia Type: General Level of consciousness: awake and alert Pain management: pain level controlled Vital Signs Assessment: post-procedure vital signs reviewed and stable Respiratory status: spontaneous breathing, nonlabored ventilation, respiratory function stable and patient connected to nasal cannula oxygen Cardiovascular status: blood pressure returned to baseline and stable Postop Assessment: no apparent nausea or vomiting Anesthetic complications: no    Kamaya Keckler A  Lillyanna Glandon

## 2019-11-25 ENCOUNTER — Other Ambulatory Visit: Payer: Self-pay | Admitting: Internal Medicine

## 2019-11-25 MED ORDER — VENLAFAXINE HCL 25 MG PO TABS: 25.0000 mg | ORAL_TABLET | Freq: Two times a day (BID) | ORAL | 0 refills | Status: DC

## 2019-12-26 LAB — HM COLONOSCOPY

## 2020-02-07 ENCOUNTER — Other Ambulatory Visit: Payer: Self-pay | Admitting: Internal Medicine

## 2020-02-21 ENCOUNTER — Telehealth: Payer: Self-pay | Admitting: Internal Medicine

## 2020-02-21 ENCOUNTER — Other Ambulatory Visit: Payer: Self-pay

## 2020-02-21 DIAGNOSIS — Z1231 Encounter for screening mammogram for malignant neoplasm of breast: Secondary | ICD-10-CM

## 2020-02-21 NOTE — Telephone Encounter (Signed)
Pt notified that order was placed for mammogram.

## 2020-02-21 NOTE — Telephone Encounter (Signed)
Pt states she would like to have her mammo. Order is needed please and Thank you!

## 2020-03-03 ENCOUNTER — Ambulatory Visit: Payer: 59 | Attending: Internal Medicine

## 2020-03-03 DIAGNOSIS — Z23 Encounter for immunization: Secondary | ICD-10-CM

## 2020-03-03 NOTE — Progress Notes (Signed)
   Covid-19 Vaccination Clinic  Name:  Kristi Coleman    MRN: [id number] DOB: 1956/05/03  03/03/2020  Kristi Coleman was observed post Covid-19 immunization for 15 minutes without incident. She was provided with Vaccine Information Sheet and instruction to access the V-Safe system.   Kristi Coleman was instructed to call 911 with any severe reactions post vaccine: Marland Kitchen Difficulty breathing  . Swelling of face and throat  . A fast heartbeat  . A bad rash all over body  . Dizziness and weakness

## 2020-04-11 IMAGING — CT CT CHEST W/O CM
2 of 4 series · 15 of 36 positions shown, 18 images · non-contrast
Comparison: 02/04/2016 and 09/03/2015

CLINICAL DATA: Follow-up lung nodules.

EXAM:
CT CHEST WITHOUT CONTRAST
TECHNIQUE: Multidetector CT imaging of the chest was performed following the
standard protocol without IV contrast.

[Series 2: chest · axial · 0.58mm/px · z∈[-1221,-953]mm · 12 of 160 slices shown, 15 images (1 of 2)]
[im 13/160  mediastinal]
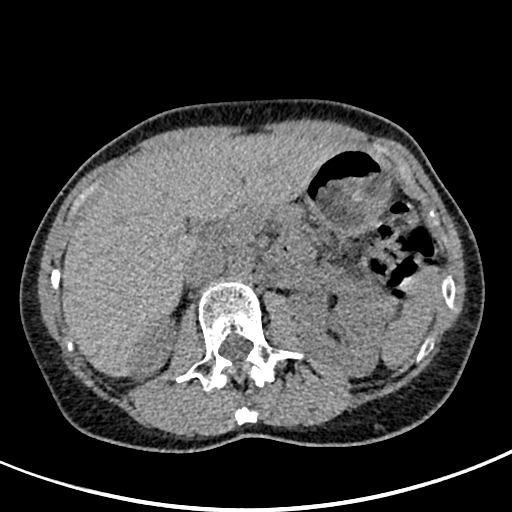
[im 13/160  lung]
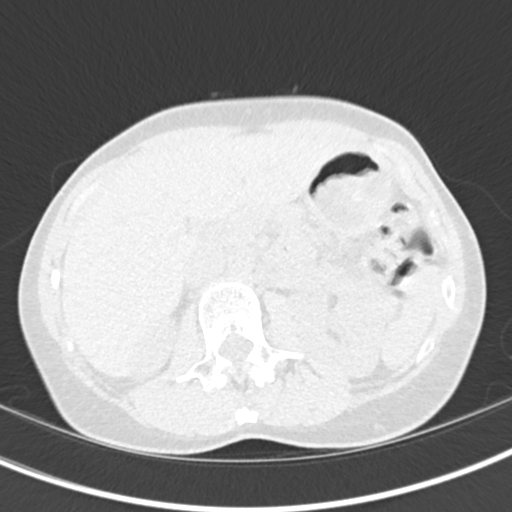
[im 25/160  lung]
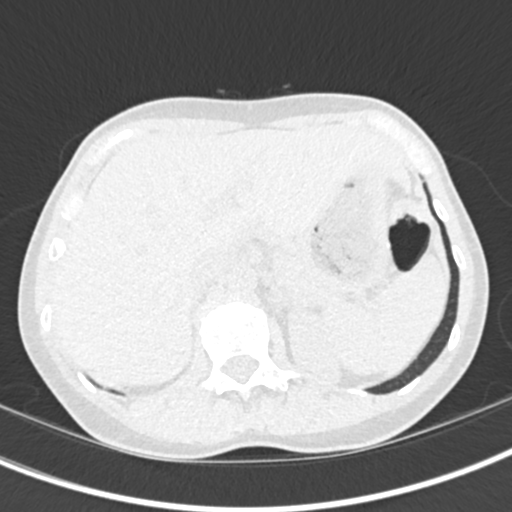
[im 37/160  lung]
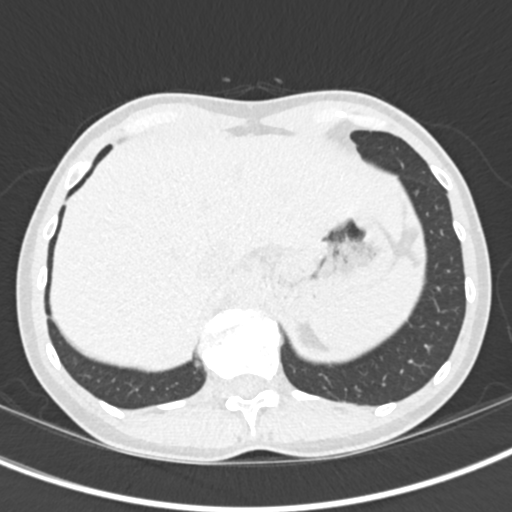
[im 49/160  lung]
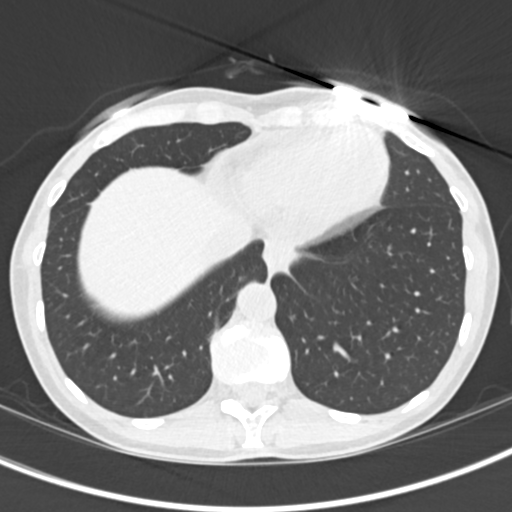
[im 62/160  mediastinal]
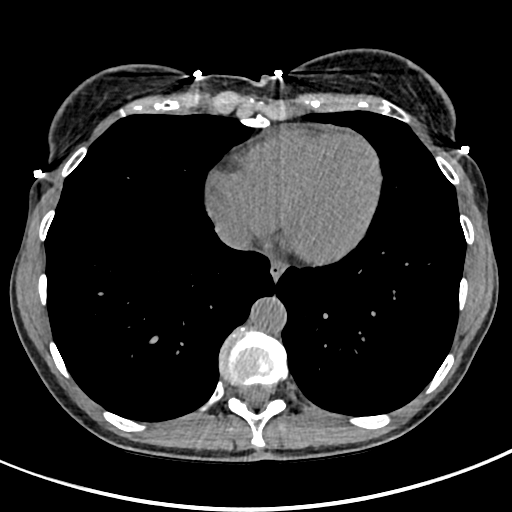
[im 62/160  lung]
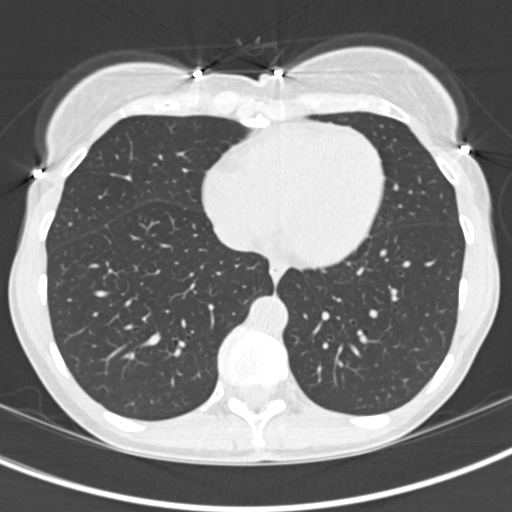
[im 74/160  lung]
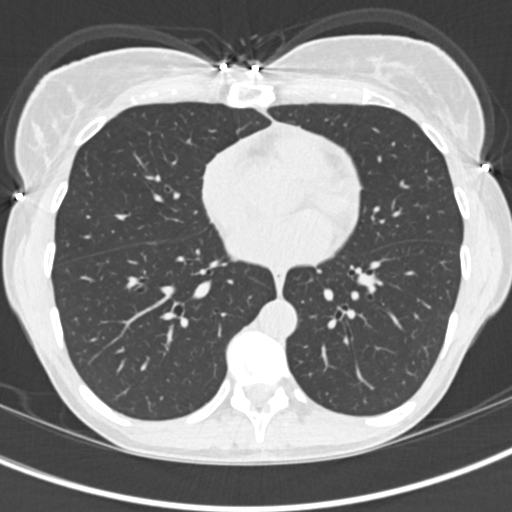
[im 86/160  lung]
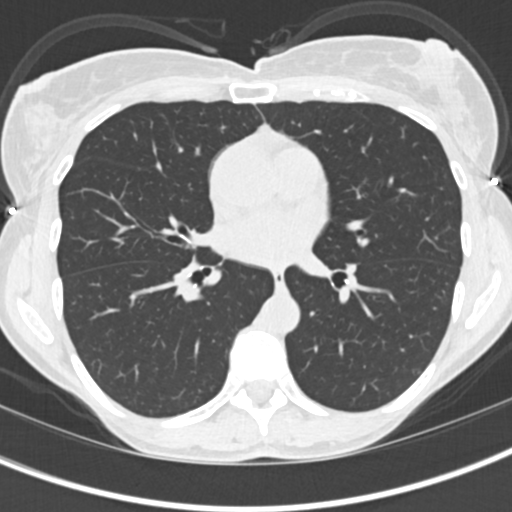
[im 98/160  lung]
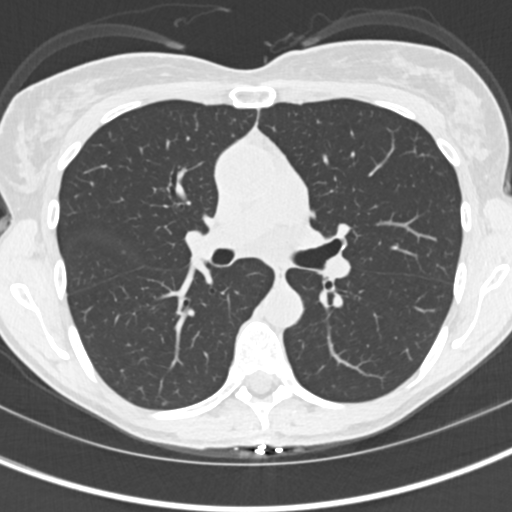
[im 111/160  mediastinal]
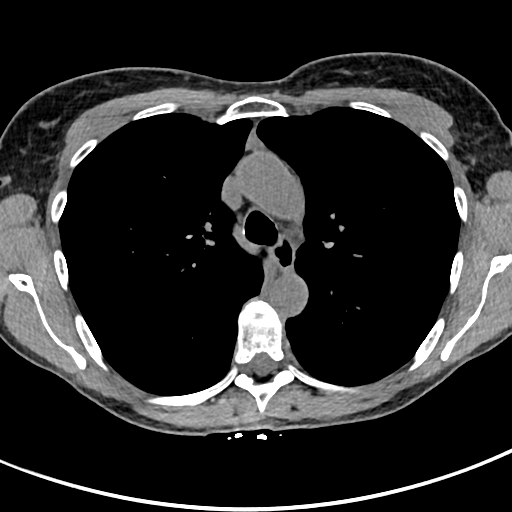
[im 111/160  lung]
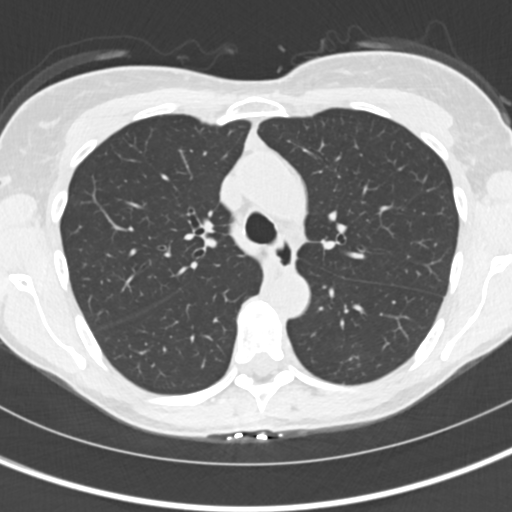
[im 123/160  lung]
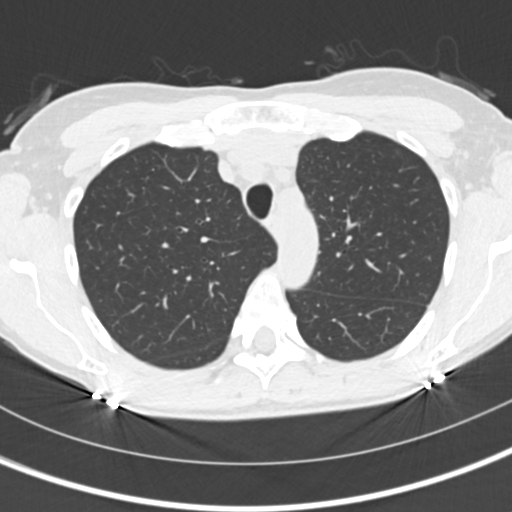
[im 135/160  lung]
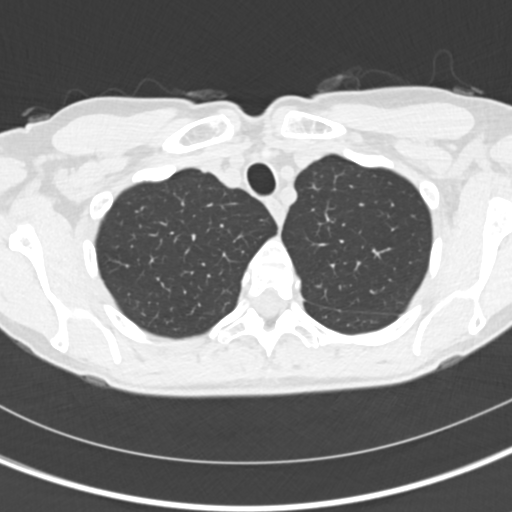
[im 147/160  lung]
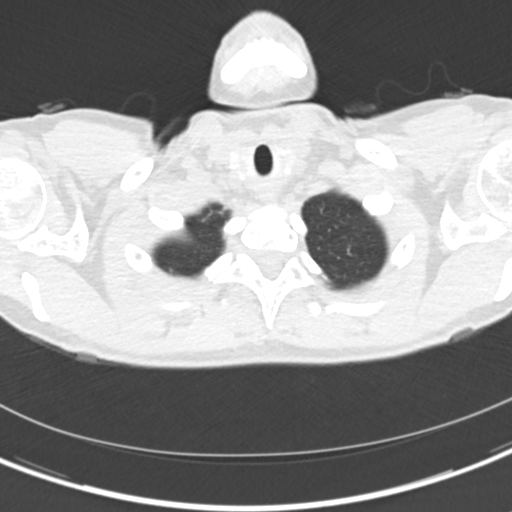

[Series 5: chest · coronal · 0.58mm/px · 3 of 125 slices shown (2 of 2)]
[im 25/125  lung]
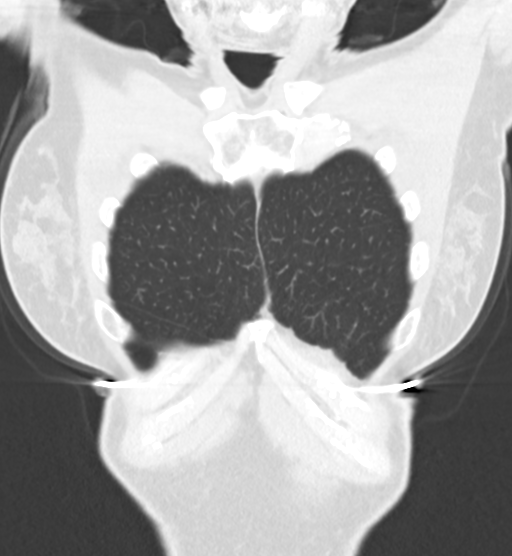
[im 50/125  lung]
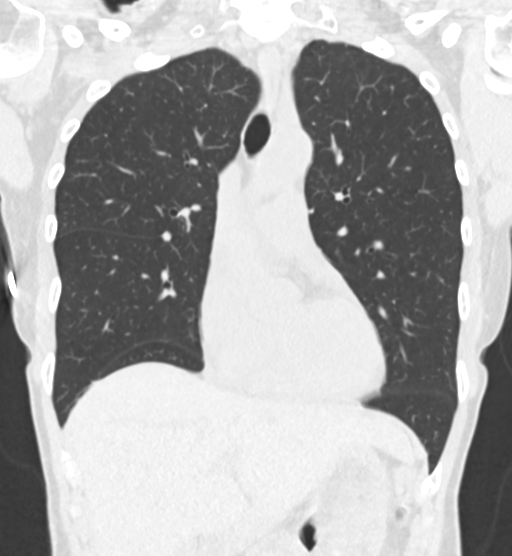
[im 75/125  lung]
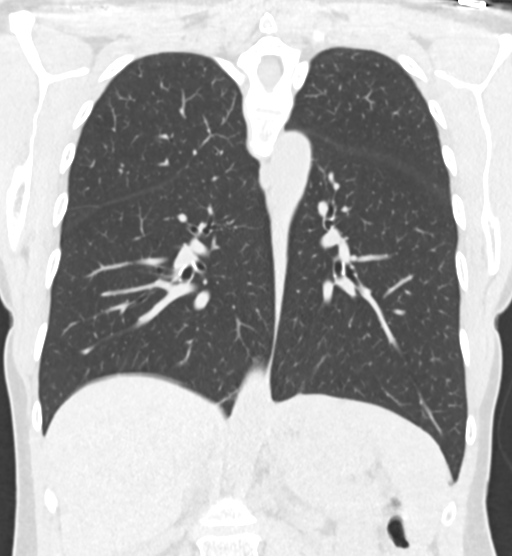

[15 of 36 positions shown; findings below may reference images not displayed]

FINDINGS: Cardiovascular: Normal caliber of the thoracic aorta without
atherosclerotic calcifications. Normal caliber of the main pulmonary
arteries. Heart size is normal without significant pericardial
fluid.

Mediastinum/Nodes: Small amount of triangular-shaped soft tissue in
anterior mediastinum is stable and probably related to thymus.
Thyroid tissue is unremarkable. No axillary lymph node enlargement.

Lungs/Pleura: Trachea and mainstem bronchi are patent. Stable 6 x 4
mm nodule in the right middle lobe on sequence 3, image 92. Punctate
nodular density in the right minor fissure on sequence 3, image 64
is stable. Stable linear density at the right lung base is most
compatible with a small focus of scarring. There appears to be a
small focus of scarring along the medial right lung base. Subtle
nodular density in the right lower lobe on sequence 3, image 104 is
unchanged. Stable punctate nodule in the right lower lobe on
sequence 3, image 65. Stable triangular shaped density in the right
lower lobe on sequence 3, image 69 measuring 4 mm. No significant
airspace disease or lung consolidation. No large pleural effusions.
Minimal scarring at the lung apices are stable.

Upper Abdomen: Images of the upper abdomen are unremarkable but
limited evaluation on this non contrast examination.

Musculoskeletal: No acute abnormality.
IMPRESSION: Stable small pulmonary nodules. No change since 6982 and compatible
with small benign pulmonary nodules.

No acute chest abnormality.

## 2020-05-10 HISTORY — PX: ROTATOR CUFF REPAIR: SHX139

## 2020-05-27 ENCOUNTER — Other Ambulatory Visit: Payer: Self-pay | Admitting: Internal Medicine

## 2020-05-27 ENCOUNTER — Telehealth: Payer: Self-pay

## 2020-05-27 DIAGNOSIS — R0789 Other chest pain: Secondary | ICD-10-CM

## 2020-05-27 NOTE — Telephone Encounter (Signed)
Got it!     Thanks =).

## 2020-05-27 NOTE — Telephone Encounter (Signed)
Orders have been corrected.  

## 2020-05-27 NOTE — Telephone Encounter (Signed)
-----   Message from Ashley Jacobs sent at 05/27/2020  2:34 PM EST ----- Regarding: Diag mammo Good afternoon,  Diag mammo and [place] order that was entered in is incorrect. Please change to [phone] [phone] Diag and IMG [phone].  Please and Thank you!

## 2020-05-29 ENCOUNTER — Ambulatory Visit
Admission: RE | Admit: 2020-05-29 | Discharge: 2020-05-29 | Disposition: A | Discharge status: Home / Self Care | Payer: 59 | Source: Ambulatory Visit | Attending: Internal Medicine | Admitting: Internal Medicine

## 2020-05-29 ENCOUNTER — Other Ambulatory Visit: Payer: Self-pay

## 2020-05-29 ENCOUNTER — Ambulatory Visit
Admission: RE | Admit: 2020-05-29 | Discharge: 2020-05-29 | Disposition: A | Discharge status: Home / Self Care | Payer: 59 | Attending: Internal Medicine | Admitting: Internal Medicine

## 2020-05-29 DIAGNOSIS — R0789 Other chest pain: Secondary | ICD-10-CM

## 2020-05-29 DIAGNOSIS — R079 Chest pain, unspecified: Secondary | ICD-10-CM | POA: Diagnosis not present

## 2020-05-30 NOTE — Progress Notes (Signed)
Your chest x ray is clear.  There is no sign of pneumonia or mass.   Deborra Medina, MD

## 2020-06-05 ENCOUNTER — Other Ambulatory Visit: Payer: Self-pay

## 2020-06-05 ENCOUNTER — Ambulatory Visit
Admission: RE | Admit: 2020-06-05 | Discharge: 2020-06-05 | Disposition: A | Discharge status: Home / Self Care | Payer: 59 | Source: Ambulatory Visit | Attending: Internal Medicine | Admitting: Internal Medicine

## 2020-06-05 DIAGNOSIS — R0789 Other chest pain: Secondary | ICD-10-CM | POA: Diagnosis not present

## 2020-06-05 DIAGNOSIS — R928 Other abnormal and inconclusive findings on diagnostic imaging of breast: Secondary | ICD-10-CM | POA: Diagnosis not present

## 2020-06-05 DIAGNOSIS — N644 Mastodynia: Secondary | ICD-10-CM | POA: Diagnosis not present

## 2020-06-10 ENCOUNTER — Other Ambulatory Visit: Payer: Self-pay | Admitting: Internal Medicine

## 2020-06-10 ENCOUNTER — Encounter: Payer: Self-pay | Admitting: Internal Medicine

## 2020-06-10 ENCOUNTER — Ambulatory Visit: Payer: 59 | Admitting: Internal Medicine

## 2020-06-10 ENCOUNTER — Other Ambulatory Visit: Payer: Self-pay

## 2020-06-10 ENCOUNTER — Ambulatory Visit (INDEPENDENT_AMBULATORY_CARE_PROVIDER_SITE_OTHER): Payer: 59

## 2020-06-10 VITALS — BP 130/78 | HR 74 | Temp 98.1°F | Ht 61.5 in | Wt 115.4 lb

## 2020-06-10 DIAGNOSIS — E559 Vitamin D deficiency, unspecified: Secondary | ICD-10-CM | POA: Diagnosis not present

## 2020-06-10 DIAGNOSIS — R0789 Other chest pain: Secondary | ICD-10-CM

## 2020-06-10 DIAGNOSIS — G8929 Other chronic pain: Secondary | ICD-10-CM

## 2020-06-10 DIAGNOSIS — M546 Pain in thoracic spine: Secondary | ICD-10-CM

## 2020-06-10 DIAGNOSIS — R5383 Other fatigue: Secondary | ICD-10-CM | POA: Diagnosis not present

## 2020-06-10 DIAGNOSIS — M7918 Myalgia, other site: Secondary | ICD-10-CM | POA: Diagnosis not present

## 2020-06-10 DIAGNOSIS — M47814 Spondylosis without myelopathy or radiculopathy, thoracic region: Secondary | ICD-10-CM | POA: Diagnosis not present

## 2020-06-10 LAB — COMPREHENSIVE METABOLIC PANEL
ALT: 20 U/L (ref 0–35)
AST: 19 U/L (ref 0–37)
Albumin: 4.6 g/dL (ref 3.5–5.2)
Alkaline Phosphatase: 51 U/L (ref 39–117)
BUN: 19 mg/dL (ref 6–23)
CO2: 27 mEq/L (ref 19–32)
Calcium: 9.6 mg/dL (ref 8.4–10.5)
Chloride: 102 mEq/L (ref 96–112)
Creatinine, Ser: 0.8 mg/dL (ref 0.40–1.20)
GFR: 77.71 mL/min (ref >60.00)
Glucose, Bld: 81 mg/dL (ref 70–99)
Potassium: 4.4 mEq/L (ref 3.5–5.1)
Sodium: 137 mEq/L (ref 135–145)
Total Bilirubin: 0.3 mg/dL (ref 0.2–1.2)
Total Protein: 6.8 g/dL (ref 6.0–8.3)

## 2020-06-10 LAB — TSH: TSH: 2.66 u[IU]/mL (ref 0.35–4.50)

## 2020-06-10 LAB — LIPID PANEL
Cholesterol: 216 mg/dL — ABNORMAL HIGH (ref 0–200)
HDL: 86 mg/dL (ref >39.00)
LDL Cholesterol: 118 mg/dL — ABNORMAL HIGH (ref 0–99)
NonHDL: 129.53
Total CHOL/HDL Ratio: 3
Triglycerides: 58 mg/dL (ref 0.0–149.0)
VLDL: 11.6 mg/dL (ref 0.0–40.0)

## 2020-06-10 LAB — CBC WITH DIFFERENTIAL/PLATELET
Basophils Absolute: 0 10*3/uL (ref 0.0–0.1)
Basophils Relative: 0.5 % (ref 0.0–3.0)
Eosinophils Absolute: 0.2 10*3/uL (ref 0.0–0.7)
Eosinophils Relative: 2.6 % (ref 0.0–5.0)
HCT: 35.7 % — ABNORMAL LOW (ref 36.0–46.0)
Hemoglobin: 12.1 g/dL (ref 12.0–15.0)
Lymphocytes Relative: 28.7 % (ref 12.0–46.0)
Lymphs Abs: 1.8 10*3/uL (ref 0.7–4.0)
MCHC: 34.1 g/dL (ref 30.0–36.0)
MCV: 93 fl (ref 78.0–100.0)
Monocytes Absolute: 0.5 10*3/uL (ref 0.1–1.0)
Monocytes Relative: 7.8 % (ref 3.0–12.0)
Neutro Abs: 3.8 10*3/uL (ref 1.4–7.7)
Neutrophils Relative %: 60.4 % (ref 43.0–77.0)
Platelets: 175 10*3/uL (ref 150.0–400.0)
RBC: 3.84 Mil/uL — ABNORMAL LOW (ref 3.87–5.11)
RDW: 12.8 % (ref 11.5–15.5)
WBC: 6.3 10*3/uL (ref 4.0–10.5)

## 2020-06-10 LAB — LIPASE: Lipase: 9 U/L — ABNORMAL LOW (ref 11.0–59.0)

## 2020-06-10 LAB — CK: Total CK: 123 U/L (ref 7–177)

## 2020-06-10 LAB — SEDIMENTATION RATE: Sed Rate: 6 mm/hr (ref 0–30)

## 2020-06-10 LAB — VITAMIN D 25 HYDROXY (VIT D DEFICIENCY, FRACTURES): VITD: 19.82 ng/mL — ABNORMAL LOW (ref 30.00–100.00)

## 2020-06-10 MED ORDER — PREDNISONE 10 MG PO TABS
ORAL_TABLET | ORAL | 0 refills | Status: DC
Start: 2020-06-10 — End: 2020-06-30

## 2020-06-10 MED ORDER — CELECOXIB 200 MG PO CAPS: 200.0000 mg | ORAL_CAPSULE | Freq: Every day | ORAL | 1 refills | Status: DC

## 2020-06-10 MED ORDER — ERGOCALCIFEROL 1.25 MG (50000 UT) PO CAPS: 50000.0000 [IU] | ORAL_CAPSULE | ORAL | 0 refills | Status: DC

## 2020-06-10 NOTE — Assessment & Plan Note (Signed)
Exam suggestive of either referred pain from thoracic spine , or strain of costochondral junction.  X ray and mammogram were done last week and normal  Checking thoracic spine films.  Trial of prednisone.

## 2020-06-10 NOTE — Progress Notes (Signed)
There is no evidence of fractures , but there are degenerative changes , mild scoliosis  and loss of bone density .  The labs are normal except for low vitamin D,  which needs replacement  (a weekly dose of Vitamin D has [facility]) . There are no signs of muscle damage .  If the prednisone does not help I recommend seeing a chiropractor or physical therapist

## 2020-06-10 NOTE — Assessment & Plan Note (Signed)
Recurrent and episodic.  Chest x ray and diagnostic mammogram both normal.  Will treat for MSK strain given history with celebrex and tylenol

## 2020-06-10 NOTE — Progress Notes (Signed)
Subjective:  Patient ID: Kristi Coleman, female    DOB: 04/02/1956  Age: 65 y.o. MRN: [id number]  CC: The primary encounter diagnosis was Chronic thoracic spine pain. Diagnoses of Fatigue, unspecified type, Intercostal muscle pain, Vitamin D deficiency, and Right-sided chest wall pain were also pertinent to this visit.  HPI Kristi Coleman presents for evaluation and management of atypical chest pain.  This visit occurred during the SARS-CoV-2 public health emergency.  Safety protocols were in place, including screening questions prior to the visit, additional usage of staff PPE, and extensive cleaning of exam room while observing appropriate contact time as indicated for disinfecting solutions.   Kristi Coleman is a 65 yr old female with no significant medical history who presents with Right sided chest wall pain has been intermittent for the past 2 months.  Feels that it may have been aggravated by a twisting stretch that she did one morning.  pain started asa  Mild twinge but  escalated to to moderate persistent throbbing over the following several days.   It localized to the right side of her sternum,  At the costochondral junction.  Initially the pain would occur only at rest,  Not with physical activity. She Started using advil and the  Pain  resolved for a few weeks,  But returned about 10 days ago after picking up a 5 gal bucket of water and hoisting it into her truck.  The pain has been constant for the last week or two,  24/7 Despite using advil and tylenol .   Not aggravated by eating or drinking.  Also notes pain between her shoulder blades to the right .    Outpatient Medications Prior to Visit  Medication Sig Dispense Refill  . venlafaxine (EFFEXOR) 25 MG tablet Take 1 tablet (25 mg total) by mouth 2 (two) times daily. 30 tablet 0  . celecoxib (CELEBREX) 200 MG capsule Take 1 capsule (200 mg total) by mouth daily. 90 capsule 1   No facility-administered medications prior to visit.     Review of Systems;  Patient denies headache, fevers, malaise, unintentional weight loss, skin rash, eye pain, sinus congestion and sinus pain, sore throat, dysphagia,  hemoptysis , cough, dyspnea, wheezing, , palpitations, orthopnea, edema, abdominal pain, nausea, melena, diarrhea, constipation, flank pain, dysuria, hematuria, urinary  Frequency, nocturia, numbness, tingling, seizures,  Focal weakness, Loss of consciousness,  Tremor, insomnia, depression, anxiety, and suicidal ideation.      Objective:  BP 130/78 (BP Location: Left Arm, Patient Position: Sitting)   Pulse 74   Temp 98.1 F (36.7 C)   Ht 5' 1.5" (1.562 m)   Wt 115 lb 6.4 oz (52.3 kg)   SpO2 98%   BMI 21.45 kg/m   BP Readings from Last 3 Encounters:  06/10/20 130/78  10/05/19 115/74  07/16/19 138/72    Wt Readings from Last 3 Encounters:  06/10/20 115 lb 6.4 oz (52.3 kg)  10/05/19 107 lb (48.5 kg)  07/16/19 112 lb 8 oz (51 kg)    General appearance: alert, cooperative and appears stated age Ears: normal TM's and external ear canals both ears Throat: lips, mucosa, and tongue normal; teeth and gums normal Neck: no adenopathy, no carotid bruit, supple, symmetrical, trachea midline and thyroid not enlarged, symmetric, no tenderness/mass/nodules Back: symmetric, no curvature. ROM normal. No CVA tenderness. Lungs: clear to auscultation bilaterally Heart: regular rate and rhythm, S1, S2 normal, no murmur, click, rub or gallop Abdomen: soft, non-tender; bowel sounds normal; no masses,  no organomegaly Pulses: 2+ and symmetric Skin: Skin color, texture, turgor normal. No rashes or lesions Lymph nodes: Cervical, supraclavicular, and axillary nodes normal. MSK:  Pain is somewhat aggravated by resisted adduction of right arm  Spine: no tenderness on spine, but tender between spine and scapula in the paraspinus muscles  No results found for: HGBA1C  Lab Results  Component Value Date   CREATININE 0.96 10/24/2017    CREATININE 0.77 07/08/2015   CREATININE 0.6 10/18/2013    Lab Results  Component Value Date   WBC 5.6 10/24/2017   HGB 12.2 10/24/2017   HCT 35.8 (L) 10/24/2017   PLT 176.0 10/24/2017   GLUCOSE 85 10/24/2017   CHOL 209 (H) 10/24/2017   TRIG 51.0 10/24/2017   HDL 89.50 10/24/2017   LDLCALC 110 (H) 10/24/2017   ALT 20 10/24/2017   AST 21 10/24/2017   NA 140 10/24/2017   K 4.1 10/24/2017   CL 104 10/24/2017   CREATININE 0.96 10/24/2017   BUN 18 10/24/2017   CO2 28 10/24/2017   TSH 2.31 10/24/2017    US BREAST LTD UNI RIGHT INC AXILLA  Result Date: 06/05/2020 CLINICAL DATA:  Right focal chest wall pain. EXAM: DIGITAL DIAGNOSTIC BILATERAL MAMMOGRAM WITH CAD AND TOMOSYNTHESIS ULTRASOUND RIGHT BREAST TECHNIQUE: Bilateral digital diagnostic mammography and breast tomosynthesis was performed. Digital images of the breasts were evaluated with computer-aided detection. Targeted ultrasound examination of the Right breast was performed. COMPARISON:  Previous exam(s). ACR Breast Density Category b: There are scattered areas of fibroglandular density. FINDINGS: No suspicious masses, calcifications, or distortion are identified in either breast. On physical exam, no suspicious lumps are identified. Targeted ultrasound is performed, showing no sonographic abnormalities in the region of the patient's focal pain. IMPRESSION: No mammographic or sonographic evidence of malignancy. RECOMMENDATION: Annual screening mammography. Treatment of the patient's pain should be based on clinical and physical exam given lack of imaging findings. I have discussed the findings and recommendations with the patient. If applicable, a reminder letter will be sent to the patient regarding the next appointment. BI-RADS CATEGORY  1: Negative. Electronically Signed   By: Dorise Bullion III M.D   On: 06/05/2020 10:42   MM DIAG BREAST TOMO BILATERAL  Result Date: 06/05/2020 CLINICAL DATA:  Right focal chest wall pain. EXAM:  DIGITAL DIAGNOSTIC BILATERAL MAMMOGRAM WITH CAD AND TOMOSYNTHESIS ULTRASOUND RIGHT BREAST TECHNIQUE: Bilateral digital diagnostic mammography and breast tomosynthesis was performed. Digital images of the breasts were evaluated with computer-aided detection. Targeted ultrasound examination of the Right breast was performed. COMPARISON:  Previous exam(s). ACR Breast Density Category b: There are scattered areas of fibroglandular density. FINDINGS: No suspicious masses, calcifications, or distortion are identified in either breast. On physical exam, no suspicious lumps are identified. Targeted ultrasound is performed, showing no sonographic abnormalities in the region of the patient's focal pain. IMPRESSION: No mammographic or sonographic evidence of malignancy. RECOMMENDATION: Annual screening mammography. Treatment of the patient's pain should be based on clinical and physical exam given lack of imaging findings. I have discussed the findings and recommendations with the patient. If applicable, a reminder letter will be sent to the patient regarding the next appointment. BI-RADS CATEGORY  1: Negative. Electronically Signed   By: Dorise Bullion III M.D   On: 06/05/2020 10:42    Assessment & Plan:   Problem List Items Addressed This Visit      Unprioritized   Right-sided chest wall pain    Exam suggestive of either referred pain from thoracic spine ,  or strain of costochondral junction.  X ray and mammogram were done last week and normal  Checking thoracic spine films.  Trial of prednisone.        Other Visit Diagnoses    Chronic thoracic spine pain    -  Primary   Relevant Medications   predniSONE (DELTASONE) 10 MG tablet   Other Relevant Orders   DG Thoracic Spine W/Swimmers   Fatigue, unspecified type       Intercostal muscle pain       Vitamin D deficiency          I have discontinued Valina M. Zia's celecoxib. I am also having her start on predniSONE. Additionally, I am having her  maintain her venlafaxine.  Meds ordered this encounter  Medications  . predniSONE (DELTASONE) 10 MG tablet    Sig: 6 tablets daily for 3 days, then reduce by 1 tablet daily until gone    Dispense:  33 tablet    Refill:  0    Medications Discontinued During This Encounter  Medication Reason  . celecoxib (CELEBREX) 200 MG capsule     Follow-up: No follow-ups on file.   Crecencio Mc, MD

## 2020-06-29 NOTE — Progress Notes (Signed)
Date:  06/30/2020   ID:  Jordy, Hewins October 29, 1955, MRN [id number]  Patient Location:  [address] [place] [address]   Provider location:   [facility], [place] office  PCP:  Crecencio Mc, MD  Cardiologist:  Arvid Right [facility]   Chief Complaint  Patient presents with  . office visit-Patient c/o R sided chest pain    History of Present Illness:    Kristi Coleman is a 65 y.o. female past medical history of  borderline hyperlipidemia,  Chronic chest pain CT calcium score of 0 in 08/2015 She presents today for f/u of prior episodes of chest pain  Continues to work at [employer] to have stress helps manage a restaurant, helps to take care of her parents.  Long history atypical chest pain Last year waxed and waned, came back this year Pain on the right side, feels deep, less likely musculoskeletal as there is no reproducibility with deep inspiration or movement or palpation Etiology unclear, feels like a bubble on the right side of her chest, deeper Stress makes it worse Was seen by Dr. Derrel Nip, treated with prednisone for 2 weeks which made symptoms better but symptoms have recurred  Reports it is actually hurting while in the office today  CT scan chest June 2019 No acute abnormalities no significant aortic atherosclerosis and no coronary calcification  EKG personally reviewed by myself on todays visit Normal sinus rhythm with rate 68 bpm no significant ST or T wave changes  Other past medical history pressure in the left chest dating back several years. Feels like a "bubble" in her chest. Often comes and goes without warning, no intervention needed. Typically comes on at rest.  at the dentist when she developed symptoms in the left chest   Prior CV studies:   The following studies were reviewed today:   Past Medical History:  Diagnosis Date  . Asthma    hx of in past  . Baker's cyst of knee, right   . Constipation   .  Degenerative arthritis of lumbar spine   . Family history of adverse reaction to anesthesia    sister and mom experienced nausea  . Menopause   . Motion sickness   . Uterine fibroid    Past Surgical History:  Procedure Laterality Date  . ANAL SPHINCTEROTOMY  2004   [place]  . COLONOSCOPY WITH PROPOFOL N/A 10/05/2019   Procedure: COLONOSCOPY WITH PROPOFOL;  Surgeon: [name], MD;  Location: [facility];  Service: Endoscopy;  Laterality: N/A;  . EXCISIONAL HEMORRHOIDECTOMY  2005   [place]     No outpatient medications have been marked as taking for the 06/30/20 encounter (Office Visit) with Minna Merritts, MD.     Allergies:   Codeine and Ibuprofen   Social History   Tobacco Use  . Smoking status: Never Smoker  . Smokeless tobacco: Never Used  . Tobacco comment: Tobacco use-no  Vaping Use  . Vaping Use: Never used  Substance Use Topics  . Alcohol use: Yes    Comment: 1-2 drinks of vodka and ginger ale nightly  . Drug use: No     No current outpatient medications on file prior to visit.   No current facility-administered medications on file prior to visit.     Family Hx: The patient's family history includes Arthritis in her father and mother; Breast cancer in her maternal grandmother and maternal uncle; Cancer in an other family member;  Colon cancer in her father; Diabetes in her mother; Heart disease in an other family member; Hyperlipidemia in her brother and brother; Hypertension in her brother and brother.  ROS:   Please see the history of present illness.    Review of Systems  Constitutional: Negative.   HENT: Negative.   Respiratory: Negative.   Cardiovascular: Negative.   Gastrointestinal: Negative.   Musculoskeletal: Negative.   Neurological: Negative.   Psychiatric/Behavioral: Negative.   All other systems reviewed and are negative.    Labs/Other Tests and Data Reviewed:    Recent Labs: 06/10/2020: ALT 20; BUN 19; Creatinine, Ser 0.80;  Hemoglobin 12.1; Platelets 175.0; Potassium 4.4; Sodium 137; TSH 2.66   Recent Lipid Panel Lab Results  Component Value Date/Time   CHOL 216 (H) 06/10/2020 12:06 PM   TRIG 58.0 06/10/2020 12:06 PM   HDL 86.00 06/10/2020 12:06 PM   CHOLHDL 3 06/10/2020 12:06 PM   LDLCALC 118 (H) 06/10/2020 12:06 PM    Wt Readings from Last 3 Encounters:  06/30/20 111 lb (50.3 kg)  06/10/20 115 lb 6.4 oz (52.3 kg)  10/05/19 107 lb (48.5 kg)     Exam:    BP 130/72 (BP Location: Left Arm, Patient Position: Sitting, Cuff Size: Normal)   Pulse 68   Ht 5' 1.5" (1.562 m)   Wt 111 lb (50.3 kg)   SpO2 98%   BMI 20.63 kg/m  Constitutional:  oriented to person, place, and time. No distress.  HENT:  Head: Grossly normal Eyes:  no discharge. No scleral icterus.  Neck: No JVD, no carotid bruits  Cardiovascular: Regular rate and rhythm, no murmurs appreciated Pulmonary/Chest: Clear to auscultation bilaterally, no wheezes or rails Abdominal: Soft.  no distension.  no tenderness.  Musculoskeletal: Normal range of motion Neurological:  normal muscle tone. Coordination normal. No atrophy Skin: Skin warm and dry Psychiatric: normal affect, pleasant   ASSESSMENT & PLAN:    Chest pain of uncertain etiology Atypical chest pain, right side Improved with prednisone but symptoms have recurred Less likely musculoskeletal, no reproducibility with movement or palpation Etiology unclear, certainly possible it is inflammatory Seems to be exacerbated by stress Requesting repeat coronary calcium scoring to visualize the area, this has been ordered -Could consider colchicine to avoid long-term NSAIDs or repeat prednisone    Total encounter time more than 25 minutes  Greater than 50% was spent in counseling and coordination of care with the patient   Signed, Ida Rogue, MD  06/30/2020 5:12 PM    [place] [facility] [address], [place], [place]  [id number]

## 2020-06-30 ENCOUNTER — Other Ambulatory Visit: Payer: Self-pay

## 2020-06-30 ENCOUNTER — Ambulatory Visit: Payer: 59 | Admitting: Cardiovascular Disease

## 2020-06-30 ENCOUNTER — Encounter: Payer: Self-pay | Admitting: Cardiovascular Disease

## 2020-06-30 VITALS — BP 130/72 | HR 68 | Ht 61.5 in | Wt 111.0 lb

## 2020-06-30 DIAGNOSIS — R079 Chest pain, unspecified: Secondary | ICD-10-CM

## 2020-06-30 NOTE — Patient Instructions (Addendum)
Medication Instructions:  No changes  Lab work: No new labs needed   Testing/Procedures: CT coronary calcium score (Chest pain)  $99 out of pocket expense  at our [facility] in [place]  This procedure uses special x-ray equipment to produce pictures of the coronary arteries to determine if they are blocked or narrowed by the buildup of plaque - an indicator for atherosclerosis or coronary artery disease (CAD).  Please call [phone] to schedule at your earliest convince   [facility] [address] [place], [place] [address]  Follow-Up:   . You will need a follow up appointment as needed  . Providers on your designated Care Team:   . Murray Hodgkins, NP . Christell Faith, PA-C . Marrianne Mood, PA-C

## 2020-07-02 ENCOUNTER — Ambulatory Visit
Admission: RE | Admit: 2020-07-02 | Discharge: 2020-07-02 | Disposition: A | Discharge status: Home / Self Care | Payer: 59 | Source: Ambulatory Visit | Attending: Cardiovascular Disease | Admitting: Cardiovascular Disease

## 2020-07-02 ENCOUNTER — Other Ambulatory Visit: Payer: Self-pay

## 2020-07-02 DIAGNOSIS — R079 Chest pain, unspecified: Secondary | ICD-10-CM

## 2020-07-08 ENCOUNTER — Telehealth: Payer: Self-pay

## 2020-07-08 NOTE — Telephone Encounter (Signed)
Able to reach pt regarding her recent CT calcium score, Dr. Rockey Situ had a chance to review her results and advised  "CT coronary ca score  Score of zero  One speckle of aortic calcification, not worried  Very good news, looks great" Kristi Coleman is delighted with the good results, reports that her CP has resolved with OITC Pepcid, wanted to know what to next, advised to f/u w/PCP regarding acid reflux and maybe Dr. Derrel Nip will refer to GI since Pepcid is relieving her symptoms. Pt agreeable with plan, otherwise all questions or concerns were address and no additional concerns at this time, will call back for anything further.

## 2020-07-22 DIAGNOSIS — D2271 Melanocytic nevi of right lower limb, including hip: Secondary | ICD-10-CM | POA: Diagnosis not present

## 2020-07-22 DIAGNOSIS — D2272 Melanocytic nevi of left lower limb, including hip: Secondary | ICD-10-CM | POA: Diagnosis not present

## 2020-07-22 DIAGNOSIS — D2261 Melanocytic nevi of right upper limb, including shoulder: Secondary | ICD-10-CM | POA: Diagnosis not present

## 2020-07-22 DIAGNOSIS — K130 Diseases of lips: Secondary | ICD-10-CM | POA: Diagnosis not present

## 2020-07-22 DIAGNOSIS — D2262 Melanocytic nevi of left upper limb, including shoulder: Secondary | ICD-10-CM | POA: Diagnosis not present

## 2020-07-22 DIAGNOSIS — D225 Melanocytic nevi of trunk: Secondary | ICD-10-CM | POA: Diagnosis not present

## 2020-07-22 DIAGNOSIS — L821 Other seborrheic keratosis: Secondary | ICD-10-CM | POA: Diagnosis not present

## 2020-07-22 DIAGNOSIS — D485 Neoplasm of uncertain behavior of skin: Secondary | ICD-10-CM | POA: Diagnosis not present

## 2020-08-15 DIAGNOSIS — M25512 Pain in left shoulder: Secondary | ICD-10-CM | POA: Diagnosis not present

## 2020-08-20 DIAGNOSIS — M25512 Pain in left shoulder: Secondary | ICD-10-CM | POA: Diagnosis not present

## 2020-08-28 DIAGNOSIS — M25512 Pain in left shoulder: Secondary | ICD-10-CM | POA: Diagnosis not present

## 2020-09-01 DIAGNOSIS — M75102 Unspecified rotator cuff tear or rupture of left shoulder, not specified as traumatic: Secondary | ICD-10-CM | POA: Diagnosis not present

## 2020-09-05 DIAGNOSIS — S46012A Strain of muscle(s) and tendon(s) of the rotator cuff of left shoulder, initial encounter: Secondary | ICD-10-CM | POA: Insufficient documentation

## 2020-09-11 DIAGNOSIS — S46112A Strain of muscle, fascia and tendon of long head of biceps, left arm, initial encounter: Secondary | ICD-10-CM | POA: Diagnosis not present

## 2020-09-11 DIAGNOSIS — Z886 Allergy status to analgesic agent status: Secondary | ICD-10-CM | POA: Diagnosis not present

## 2020-09-11 DIAGNOSIS — S46012A Strain of muscle(s) and tendon(s) of the rotator cuff of left shoulder, initial encounter: Secondary | ICD-10-CM | POA: Diagnosis not present

## 2020-09-11 DIAGNOSIS — G8918 Other acute postprocedural pain: Secondary | ICD-10-CM | POA: Diagnosis not present

## 2020-09-11 DIAGNOSIS — S4382XA Sprain of other specified parts of left shoulder girdle, initial encounter: Secondary | ICD-10-CM | POA: Diagnosis not present

## 2020-09-11 DIAGNOSIS — Z885 Allergy status to narcotic agent status: Secondary | ICD-10-CM | POA: Diagnosis not present

## 2020-09-25 DIAGNOSIS — S46012D Strain of muscle(s) and tendon(s) of the rotator cuff of left shoulder, subsequent encounter: Secondary | ICD-10-CM | POA: Diagnosis not present

## 2020-10-02 DIAGNOSIS — S46012D Strain of muscle(s) and tendon(s) of the rotator cuff of left shoulder, subsequent encounter: Secondary | ICD-10-CM | POA: Diagnosis not present

## 2020-10-13 DIAGNOSIS — S46012D Strain of muscle(s) and tendon(s) of the rotator cuff of left shoulder, subsequent encounter: Secondary | ICD-10-CM | POA: Diagnosis not present

## 2020-10-16 DIAGNOSIS — S46012D Strain of muscle(s) and tendon(s) of the rotator cuff of left shoulder, subsequent encounter: Secondary | ICD-10-CM | POA: Diagnosis not present

## 2020-10-20 DIAGNOSIS — S46012D Strain of muscle(s) and tendon(s) of the rotator cuff of left shoulder, subsequent encounter: Secondary | ICD-10-CM | POA: Diagnosis not present

## 2020-10-23 DIAGNOSIS — S46012D Strain of muscle(s) and tendon(s) of the rotator cuff of left shoulder, subsequent encounter: Secondary | ICD-10-CM | POA: Diagnosis not present

## 2020-10-23 DIAGNOSIS — S6982XA Other specified injuries of left wrist, hand and finger(s), initial encounter: Secondary | ICD-10-CM | POA: Diagnosis not present

## 2020-10-23 DIAGNOSIS — M25532 Pain in left wrist: Secondary | ICD-10-CM | POA: Diagnosis not present

## 2020-10-28 DIAGNOSIS — S46012D Strain of muscle(s) and tendon(s) of the rotator cuff of left shoulder, subsequent encounter: Secondary | ICD-10-CM | POA: Diagnosis not present

## 2020-11-04 DIAGNOSIS — S46012D Strain of muscle(s) and tendon(s) of the rotator cuff of left shoulder, subsequent encounter: Secondary | ICD-10-CM | POA: Diagnosis not present

## 2020-11-11 DIAGNOSIS — S46012D Strain of muscle(s) and tendon(s) of the rotator cuff of left shoulder, subsequent encounter: Secondary | ICD-10-CM | POA: Diagnosis not present

## 2020-11-18 DIAGNOSIS — S46012D Strain of muscle(s) and tendon(s) of the rotator cuff of left shoulder, subsequent encounter: Secondary | ICD-10-CM | POA: Diagnosis not present

## 2020-12-04 DIAGNOSIS — S46012D Strain of muscle(s) and tendon(s) of the rotator cuff of left shoulder, subsequent encounter: Secondary | ICD-10-CM | POA: Diagnosis not present

## 2020-12-15 DIAGNOSIS — S46012D Strain of muscle(s) and tendon(s) of the rotator cuff of left shoulder, subsequent encounter: Secondary | ICD-10-CM | POA: Diagnosis not present

## 2021-01-05 DIAGNOSIS — S46012D Strain of muscle(s) and tendon(s) of the rotator cuff of left shoulder, subsequent encounter: Secondary | ICD-10-CM | POA: Diagnosis not present

## 2021-01-22 DIAGNOSIS — S46012D Strain of muscle(s) and tendon(s) of the rotator cuff of left shoulder, subsequent encounter: Secondary | ICD-10-CM | POA: Diagnosis not present

## 2021-02-04 DIAGNOSIS — S46012D Strain of muscle(s) and tendon(s) of the rotator cuff of left shoulder, subsequent encounter: Secondary | ICD-10-CM | POA: Diagnosis not present

## 2021-02-16 DIAGNOSIS — S46012D Strain of muscle(s) and tendon(s) of the rotator cuff of left shoulder, subsequent encounter: Secondary | ICD-10-CM | POA: Diagnosis not present

## 2021-03-03 DIAGNOSIS — S46012D Strain of muscle(s) and tendon(s) of the rotator cuff of left shoulder, subsequent encounter: Secondary | ICD-10-CM | POA: Diagnosis not present

## 2021-03-16 DIAGNOSIS — S46012D Strain of muscle(s) and tendon(s) of the rotator cuff of left shoulder, subsequent encounter: Secondary | ICD-10-CM | POA: Diagnosis not present

## 2021-03-26 DIAGNOSIS — S46012D Strain of muscle(s) and tendon(s) of the rotator cuff of left shoulder, subsequent encounter: Secondary | ICD-10-CM | POA: Diagnosis not present

## 2021-04-09 DIAGNOSIS — S46012D Strain of muscle(s) and tendon(s) of the rotator cuff of left shoulder, subsequent encounter: Secondary | ICD-10-CM | POA: Diagnosis not present

## 2021-04-23 DIAGNOSIS — S46012D Strain of muscle(s) and tendon(s) of the rotator cuff of left shoulder, subsequent encounter: Secondary | ICD-10-CM | POA: Diagnosis not present

## 2021-05-21 DIAGNOSIS — S46012D Strain of muscle(s) and tendon(s) of the rotator cuff of left shoulder, subsequent encounter: Secondary | ICD-10-CM | POA: Diagnosis not present

## 2021-07-27 ENCOUNTER — Other Ambulatory Visit: Payer: Self-pay | Admitting: Internal Medicine

## 2021-07-27 DIAGNOSIS — D485 Neoplasm of uncertain behavior of skin: Secondary | ICD-10-CM | POA: Diagnosis not present

## 2021-07-27 DIAGNOSIS — B353 Tinea pedis: Secondary | ICD-10-CM | POA: Diagnosis not present

## 2021-07-27 DIAGNOSIS — D2271 Melanocytic nevi of right lower limb, including hip: Secondary | ICD-10-CM | POA: Diagnosis not present

## 2021-07-27 DIAGNOSIS — Z1231 Encounter for screening mammogram for malignant neoplasm of breast: Secondary | ICD-10-CM

## 2021-07-27 DIAGNOSIS — L821 Other seborrheic keratosis: Secondary | ICD-10-CM | POA: Diagnosis not present

## 2021-07-27 DIAGNOSIS — D225 Melanocytic nevi of trunk: Secondary | ICD-10-CM | POA: Diagnosis not present

## 2021-07-27 DIAGNOSIS — D2262 Melanocytic nevi of left upper limb, including shoulder: Secondary | ICD-10-CM | POA: Diagnosis not present

## 2021-07-27 DIAGNOSIS — D2261 Melanocytic nevi of right upper limb, including shoulder: Secondary | ICD-10-CM | POA: Diagnosis not present

## 2021-07-27 DIAGNOSIS — D2272 Melanocytic nevi of left lower limb, including hip: Secondary | ICD-10-CM | POA: Diagnosis not present

## 2021-07-27 DIAGNOSIS — R202 Paresthesia of skin: Secondary | ICD-10-CM | POA: Diagnosis not present

## 2021-09-01 ENCOUNTER — Ambulatory Visit
Admission: RE | Admit: 2021-09-01 | Discharge: 2021-09-01 | Disposition: A | Discharge status: Home / Self Care | Payer: 59 | Source: Ambulatory Visit | Attending: Internal Medicine | Admitting: Internal Medicine

## 2021-09-01 DIAGNOSIS — Z1231 Encounter for screening mammogram for malignant neoplasm of breast: Secondary | ICD-10-CM | POA: Diagnosis not present

## 2021-09-02 ENCOUNTER — Other Ambulatory Visit: Payer: Self-pay | Admitting: Internal Medicine

## 2021-09-02 DIAGNOSIS — R928 Other abnormal and inconclusive findings on diagnostic imaging of breast: Secondary | ICD-10-CM

## 2021-09-10 ENCOUNTER — Ambulatory Visit
Admission: RE | Admit: 2021-09-10 | Discharge: 2021-09-10 | Disposition: A | Discharge status: Home / Self Care | Payer: 59 | Source: Ambulatory Visit | Attending: Internal Medicine | Admitting: Internal Medicine

## 2021-09-10 ENCOUNTER — Encounter: Payer: Self-pay | Admitting: Gastroenterology

## 2021-09-10 ENCOUNTER — Ambulatory Visit: Payer: 59 | Admitting: Gastroenterology

## 2021-09-10 VITALS — BP 130/81 | HR 74 | Temp 97.9°F | Wt 110.0 lb

## 2021-09-10 DIAGNOSIS — K648 Other hemorrhoids: Secondary | ICD-10-CM | POA: Diagnosis not present

## 2021-09-10 DIAGNOSIS — R922 Inconclusive mammogram: Secondary | ICD-10-CM | POA: Diagnosis not present

## 2021-09-10 DIAGNOSIS — R928 Other abnormal and inconclusive findings on diagnostic imaging of breast: Secondary | ICD-10-CM | POA: Insufficient documentation

## 2021-09-10 NOTE — Progress Notes (Signed)
?  ?Jonathon Bellows MD, MRCP(U.K) ?[address]  ?[address]  ?[place], [place] [address]  ?Main: [phone]  ?Fax: [phone] ? ? ?Gastroenterology Consultation ? ?Referring Provider:     Crecencio Mc, MD ?Primary Care Physician:  Crecencio Mc, MD ?Primary Gastroenterologist:  Dr. Jonathon Bellows  ?Reason for Consultation:     Discuss about hemorrhoidal banding ?      ? HPI:   ?Kristi Coleman is a 66 y.o. y/o female referred for consultation & management  by Dr. Crecencio Mc, MD.   ?She states that she has had longstanding issues with her hemorrhoids.  She has had them surgically treated in the past.  She has had some recurrence of perianal itching discomfort prolapsing as well as intermittent rectal bleeding.  She has tried topical therapies conservative management and has failed and would like to get her hemorrhoids banded ? ? ?He had a colonoscopy in May 2021 showed no polyps but she did have grade 2 internal hemorrhoids. ? ?Past Medical History:  ?Diagnosis Date  ? Asthma   ? hx of in past  ? Baker's cyst of knee, right   ? Constipation   ? Degenerative arthritis of lumbar spine   ? Family history of adverse reaction to anesthesia   ? sister and mom experienced nausea  ? Menopause   ? Motion sickness   ? Uterine fibroid   ? ? ?Past Surgical History:  ?Procedure Laterality Date  ? ANAL SPHINCTEROTOMY  2004  ? [facility]  ? COLONOSCOPY WITH PROPOFOL N/A 10/05/2019  ? Procedure: COLONOSCOPY WITH PROPOFOL;  Surgeon: [name], MD;  Location: [facility];  Service: Endoscopy;  Laterality: N/A;  ? EXCISIONAL HEMORRHOIDECTOMY  2005  ? [place]  ? ? ?Prior to Admission medications   ?Not on File  ? ? ?Family History  ?Problem Relation Age of Onset  ? Cancer Other   ? Breast cancer Maternal Uncle   ? Heart disease Other   ?     In some men in her family  ? Arthritis Mother   ? Diabetes Mother   ? Arthritis Father   ? Colon cancer Father   ? Hyperlipidemia Brother   ? Hypertension Brother   ? Hypertension  Brother   ? Hyperlipidemia Brother   ? Breast cancer Maternal Grandmother   ?  ? ?Social History  ? ?Tobacco Use  ? Smoking status: Never  ? Smokeless tobacco: Never  ? Tobacco comments:  ?  Tobacco use-no  ?Vaping Use  ? Vaping Use: Never used  ?Substance Use Topics  ? Alcohol use: Yes  ?  Comment: 1-2 drinks of vodka and ginger ale nightly  ? Drug use: No  ? ? ?Allergies as of 09/10/2021 - Review Complete 06/30/2020  ?Allergen Reaction Noted  ? Codeine Nausea Only 01/28/2009  ? Ibuprofen Swelling 10/17/2013  ? ? ?Review of Systems:    ?All systems reviewed and negative except where noted in HPI. ? ? Physical Exam:  ?There were no vitals taken for this visit. ?No LMP recorded. Patient is postmenopausal. ?Psych:  Alert and cooperative. Normal mood and affect. ?General:   Alert,  Well-developed, well-nourished, pleasant and cooperative in NAD ?Head:  Normocephalic and atraumatic. ?Eyes:  Sclera clear, no icterus.   Conjunctiva pink. ?Ears:  Normal auditory acuity. ?Neurologic:  Alert and oriented x3;  grossly normal neurologically. ?Psych:  Alert and cooperative. Normal mood and affect. ?PROCEDURE NOTE: Chaperone present ?The patient presents with symptomatic grade 2  hemorrhoids, unresponsive to maximal medical therapy, requesting rubber band ligation of his/her hemorrhoidal disease.  All risks, benefits and alternative forms of therapy were described and informed consent was obtained. ? ?In the Left Lateral Decubitus position (if anoscopy is performed) anoscopic examination revealed grade 2 hemorrhoids in the RA and LL position(s).  ? ?The decision was made to band the RA internal hemorrhoid, and the [place] O?Regan System was used to perform band ligation without complication.  Digital anorectal examination was then performed to assure proper positioning of the band, and to adjust the banded tissue as required.  The patient was discharged home without pain or other issues.  Dietary and behavioral recommendations were  given and (if necessary - prescriptions were given), along with follow-up instructions.  The patient will return 4 weeks for follow-up and possible additional banding as required. ? ?No complications were encountered and the patient tolerated the procedure well. ?  ?Imaging Studies: ?MM 3D SCREEN BREAST BILATERAL ? ?Result Date: 09/02/2021 ?CLINICAL DATA:  Screening. EXAM: DIGITAL SCREENING BILATERAL MAMMOGRAM WITH TOMOSYNTHESIS AND CAD TECHNIQUE: Bilateral screening digital craniocaudal and mediolateral oblique mammograms were obtained. Bilateral screening digital breast tomosynthesis was performed. COMPARISON:  Previous exam(s). ACR Breast Density Category c: The breast tissue is heterogeneously dense, which may obscure small masses. FINDINGS: In the right breast, possible distortion warrants further evaluation. In the left breast, no findings suspicious for malignancy. IMPRESSION: Further evaluation is suggested for possible distortion in the right breast. RECOMMENDATION: Diagnostic mammogram and possibly ultrasound of the right breast. (Code:FI-R-18M) The patient will be contacted regarding the findings, and additional imaging will be scheduled. BI-RADS CATEGORY  0: Incomplete. Need additional imaging evaluation and/or prior mammograms for comparison. Electronically Signed   By: Ileana Roup M.D.   On: 09/02/2021 13:40   ? ?Assessment and Plan:  ? ?Kristi Coleman is a 66 y.o. y/o female has been referred for evaluation of internal hemorrhoids.  I performed hemorrhoidal banding of the right anterior column.  Advised to take stool softeners for the next 2 weeks.  She has been given instructions if she has any pain or discomfort to call my office and return back in the morning ? ?Follow up in 4-week ? ?Dr Jonathon Bellows MD,MRCP(U.K) ? ?

## 2021-10-22 ENCOUNTER — Encounter: Payer: Self-pay | Admitting: Gastroenterology

## 2021-10-22 ENCOUNTER — Ambulatory Visit: Payer: 59 | Admitting: Gastroenterology

## 2021-10-22 VITALS — BP 126/74 | HR 71 | Temp 97.6°F | Wt 114.2 lb

## 2021-10-22 DIAGNOSIS — K648 Other hemorrhoids: Secondary | ICD-10-CM

## 2021-10-22 NOTE — Progress Notes (Signed)
Patient follow-ups today for banding of hemorrhoids    Summary of history : She states that she has had longstanding issues with her hemorrhoids.  She has had them surgically treated in the past.  She has had some recurrence of perianal itching discomfort prolapsing as well as intermittent rectal bleeding.  She has tried topical therapies conservative management and has failed and would like to get her hemorrhoids banded    She has had a colonoscopy in May 2021 showed no polyps but she did have grade 2 internal hemorrhoids.  First round:09/10/2021: RA column banded     Interval history   09/10/2021-10/22/2021  Decrease in the amount of rectal bleeding still persists on and off  Digital rectal exam performed in the presence of a chaperone. External anal findings:  Internal findings:no  , No masses, no blood on glove noticed.    PROCEDURE NOTE: The patient presents with symptomatic grade 2 hemorrhoids, unresponsive to maximal medical therapy, requesting rubber band ligation of his/her hemorrhoidal disease.  All risks, benefits and alternative forms of therapy were described and informed consent was obtained.  In the Left Lateral Decubitus position (if anoscopy is performed) anoscopic examination revealed grade 2 hemorrhoids in the LL position(s).   The decision was made to band the LL internal hemorrhoid, and the [place] was used to perform band ligation without complication.  Digital anorectal examination was then performed to assure proper positioning of the band, and to adjust the banded tissue as required.  The patient was discharged home without pain or other issues.  Dietary and behavioral recommendations were given and (if necessary - prescriptions were given), along with follow-up instructions.  The patient will return 4 weeks for follow-up and possible additional banding as required.  No complications were encountered and the patient tolerated the procedure  well.   Plan:  Avoid constipation.  Commence on stool softeners if not already on  Follow-up: 4 weeks  Dr Jonathon Bellows MD,MRCP [facility]) Gastroenterology/Hepatology Pager: [phone]

## 2021-11-05 DIAGNOSIS — R42 Dizziness and giddiness: Secondary | ICD-10-CM | POA: Diagnosis not present

## 2021-11-19 ENCOUNTER — Ambulatory Visit (INDEPENDENT_AMBULATORY_CARE_PROVIDER_SITE_OTHER): Payer: Self-pay | Admitting: Gastroenterology

## 2021-11-19 DIAGNOSIS — Z91199 Patient's noncompliance with other medical treatment and regimen due to unspecified reason: Secondary | ICD-10-CM

## 2021-11-19 NOTE — Progress Notes (Signed)
NO SHOW

## 2021-12-29 ENCOUNTER — Ambulatory Visit: Payer: 59 | Admitting: Gastroenterology

## 2021-12-29 ENCOUNTER — Encounter: Payer: Self-pay | Admitting: Gastroenterology

## 2021-12-29 VITALS — BP 115/70 | HR 67 | Temp 98.2°F | Ht 61.0 in | Wt 115.2 lb

## 2021-12-29 DIAGNOSIS — K648 Other hemorrhoids: Secondary | ICD-10-CM

## 2021-12-29 NOTE — Progress Notes (Signed)
Patient follow-ups today for banding of hemorrhoids    Summary of history :  She states that she has had longstanding issues with her hemorrhoids.  She has had them surgically treated in the past.  She has had some recurrence of perianal itching discomfort prolapsing as well as intermittent rectal bleeding.  She has tried topical therapies conservative management and has failed and would like to get her hemorrhoids banded    She has had a colonoscopy in May 2021 showed no polyps but she did have grade 2 internal hemorrhoids.   First round:09/10/2021: RA column banded  Second round 10/22/2021: LL column banded        Interval history  10/22/2021-12/29/2021   Decrease in the amount of rectal bleeding still having on and off bleeding    Digital rectal exam performed in the presence of a chaperone. External anal findings: normal  Internal findings:= , No masses, no blood on glove noticed.    PROCEDURE NOTE: The patient presents with symptomatic grade 1 hemorrhoids, unresponsive to maximal medical therapy, requesting rubber band ligation of his/her hemorrhoidal disease.  All risks, benefits and alternative forms of therapy were described and informed consent was obtained.  In the Left Lateral Decubitus position (if anoscopy is performed) anoscopic examination revealed grade 1 hemorrhoids in the LL position(s).   The decision was made to band the LL internal hemorrhoid, and the [place] was used to perform band ligation without complication.  Digital anorectal examination was then performed to assure proper positioning of the band, and to adjust the banded tissue as required.  The patient was discharged home without pain or other issues.  Dietary and behavioral recommendations were given and (if necessary - prescriptions were given), along with follow-up instructions.  The patient will return  as needed for follow-up and possible additional banding as required.  No complications were  encountered and the patient tolerated the procedure well.   Plan:  Avoid constipation.  Explained that if she still symptomatic we could place 1 or 2 more bands.  She has had prior hemorrhoidal surgery and I am willing to try my best to see as much benefit we could provide with an outpatient procedure rather than surgery.  If she still having issues she will come back and see [place] in 4 weeks  Follow-up: As needed  Dr Jonathon Bellows MD,MRCP [facility]) Gastroenterology/Hepatology Pager: [phone]

## 2022-02-01 ENCOUNTER — Encounter: Payer: Self-pay | Admitting: Gastroenterology

## 2022-02-01 ENCOUNTER — Ambulatory Visit (INDEPENDENT_AMBULATORY_CARE_PROVIDER_SITE_OTHER): Payer: 59 | Admitting: Gastroenterology

## 2022-02-01 VITALS — BP 138/81 | HR 74 | Temp 99.0°F | Ht 61.0 in | Wt 114.0 lb

## 2022-02-01 DIAGNOSIS — K648 Other hemorrhoids: Secondary | ICD-10-CM

## 2022-02-01 NOTE — Progress Notes (Signed)
Patient follow-ups today for banding of hemorrhoids    Summary of history :    She states that she has had longstanding issues with her hemorrhoids.  She has had them surgically treated in the past.  She has had some recurrence of perianal itching discomfort prolapsing as well as intermittent rectal bleeding.  She has tried topical therapies conservative management and has failed and would like to get her hemorrhoids banded    She has had a colonoscopy in May 2021 showed no polyps but she did have grade 2 internal hemorrhoids.   First round:09/10/2021: RA column banded  Second round 10/22/2021: LL column banded  Third round 12/29/2021 left lateral column banded     Interval history  12/29/2021- 02/01/2022   Overall bleeding has reduced from the time when she did not have her hemorrhoids banded but bleeding persists when she wipes since her last visit.    Digital rectal exam performed in the presence of a chaperone. External anal findings: No abnormalities Internal findings: No abnormalities, No masses, no blood on glove noticed.    PROCEDURE NOTE: The patient presents with symptomatic grade 1 hemorrhoids, unresponsive to maximal medical therapy, requesting rubber band ligation of his/her hemorrhoidal disease.  All risks, benefits and alternative forms of therapy were described and informed consent was obtained.  In the Left Lateral Decubitus position (if anoscopy is performed) anoscopic examination revealed grade 1 hemorrhoids in the RP and LL position(s).   The decision was made to band the RP internal hemorrhoid, and the [place] was used to perform band ligation without complication.  Digital anorectal examination was then performed to assure proper positioning of the band, and to adjust the banded tissue as required.  The patient was discharged home without pain or other issues.  Dietary and behavioral recommendations were given and (if necessary - prescriptions were given),  along with follow-up instructions.  The patient will return     as needed for follow-up and possible additional banding as required.  No complications were encountered and the patient tolerated the procedure well.   Plan:  Explained that this is the fourth band we have placed.  Continues to have some bleeding.  We could try 1 more time if required but if it does not help with the option would be for surgical intervention.  Follow-up: As needed  Dr Jonathon Bellows MD,MRCP [facility]) Gastroenterology/Hepatology Pager: [phone]

## 2022-06-07 DIAGNOSIS — L239 Allergic contact dermatitis, unspecified cause: Secondary | ICD-10-CM | POA: Diagnosis not present

## 2022-07-30 DIAGNOSIS — L814 Other melanin hyperpigmentation: Secondary | ICD-10-CM | POA: Diagnosis not present

## 2022-07-30 DIAGNOSIS — R202 Paresthesia of skin: Secondary | ICD-10-CM | POA: Diagnosis not present

## 2022-07-30 DIAGNOSIS — K130 Diseases of lips: Secondary | ICD-10-CM | POA: Diagnosis not present

## 2022-07-30 DIAGNOSIS — I781 Nevus, non-neoplastic: Secondary | ICD-10-CM | POA: Diagnosis not present

## 2022-07-30 DIAGNOSIS — D1801 Hemangioma of skin and subcutaneous tissue: Secondary | ICD-10-CM | POA: Diagnosis not present

## 2022-08-11 ENCOUNTER — Other Ambulatory Visit: Payer: Self-pay | Admitting: Internal Medicine

## 2022-08-11 DIAGNOSIS — Z1231 Encounter for screening mammogram for malignant neoplasm of breast: Secondary | ICD-10-CM

## 2022-09-13 ENCOUNTER — Ambulatory Visit
Admission: RE | Admit: 2022-09-13 | Discharge: 2022-09-13 | Disposition: A | Discharge status: Home / Self Care | Payer: 59 | Source: Ambulatory Visit | Attending: Internal Medicine | Admitting: Internal Medicine

## 2022-09-13 DIAGNOSIS — Z1231 Encounter for screening mammogram for malignant neoplasm of breast: Secondary | ICD-10-CM

## 2022-09-20 ENCOUNTER — Other Ambulatory Visit: Payer: Self-pay | Admitting: Internal Medicine

## 2022-09-20 ENCOUNTER — Other Ambulatory Visit (INDEPENDENT_AMBULATORY_CARE_PROVIDER_SITE_OTHER): Payer: 59

## 2022-09-20 ENCOUNTER — Ambulatory Visit
Admission: RE | Admit: 2022-09-20 | Discharge: 2022-09-20 | Disposition: A | Discharge status: Home / Self Care | Payer: 59 | Source: Ambulatory Visit | Attending: Internal Medicine | Admitting: Internal Medicine

## 2022-09-20 ENCOUNTER — Ambulatory Visit
Admission: RE | Admit: 2022-09-20 | Discharge: 2022-09-20 | Disposition: A | Discharge status: Home / Self Care | Payer: 59 | Attending: Internal Medicine | Admitting: Internal Medicine

## 2022-09-20 DIAGNOSIS — M545 Low back pain, unspecified: Secondary | ICD-10-CM

## 2022-09-20 DIAGNOSIS — M47816 Spondylosis without myelopathy or radiculopathy, lumbar region: Secondary | ICD-10-CM | POA: Diagnosis not present

## 2022-09-20 DIAGNOSIS — R1032 Left lower quadrant pain: Secondary | ICD-10-CM

## 2022-09-20 LAB — URINALYSIS, ROUTINE W REFLEX MICROSCOPIC
Bilirubin Urine: NEGATIVE
Ketones, ur: NEGATIVE
Leukocytes,Ua: NEGATIVE
Nitrite: NEGATIVE
Specific Gravity, Urine: 1.01 (ref 1.000–1.030)
Total Protein, Urine: NEGATIVE
Urine Glucose: NEGATIVE
Urobilinogen, UA: 0.2 (ref 0.0–1.0)
pH: 6 (ref 5.0–8.0)

## 2022-09-20 NOTE — Addendum Note (Signed)
Addended by: Sherlene Shams on: 09/20/2022 04:58 PM   Modules accepted: Orders

## 2022-09-21 ENCOUNTER — Ambulatory Visit
Admission: RE | Admit: 2022-09-21 | Discharge: 2022-09-21 | Disposition: A | Discharge status: Home / Self Care | Payer: 59 | Source: Ambulatory Visit | Attending: Internal Medicine | Admitting: Internal Medicine

## 2022-09-21 ENCOUNTER — Other Ambulatory Visit: Payer: Self-pay | Admitting: Internal Medicine

## 2022-09-21 ENCOUNTER — Ambulatory Visit: Payer: 59 | Admitting: Internal Medicine

## 2022-09-21 ENCOUNTER — Encounter: Payer: Self-pay | Admitting: Internal Medicine

## 2022-09-21 VITALS — BP 136/76 | HR 66 | Temp 98.1°F | Ht 61.0 in | Wt 114.8 lb

## 2022-09-21 DIAGNOSIS — R109 Unspecified abdominal pain: Secondary | ICD-10-CM | POA: Diagnosis not present

## 2022-09-21 DIAGNOSIS — E559 Vitamin D deficiency, unspecified: Secondary | ICD-10-CM

## 2022-09-21 DIAGNOSIS — K5900 Constipation, unspecified: Secondary | ICD-10-CM | POA: Diagnosis not present

## 2022-09-21 DIAGNOSIS — R1032 Left lower quadrant pain: Secondary | ICD-10-CM

## 2022-09-21 DIAGNOSIS — D259 Leiomyoma of uterus, unspecified: Secondary | ICD-10-CM | POA: Diagnosis not present

## 2022-09-21 DIAGNOSIS — R319 Hematuria, unspecified: Secondary | ICD-10-CM | POA: Diagnosis not present

## 2022-09-21 DIAGNOSIS — R310 Gross hematuria: Secondary | ICD-10-CM

## 2022-09-21 DIAGNOSIS — R311 Benign essential microscopic hematuria: Secondary | ICD-10-CM

## 2022-09-21 LAB — CBC WITH DIFFERENTIAL/PLATELET
Basophils Absolute: 0 10*3/uL (ref 0.0–0.1)
Basophils Relative: 0.7 % (ref 0.0–3.0)
Eosinophils Absolute: 0.2 10*3/uL (ref 0.0–0.7)
Eosinophils Relative: 3 % (ref 0.0–5.0)
HCT: 36.9 % (ref 36.0–46.0)
Hemoglobin: 12.5 g/dL (ref 12.0–15.0)
Lymphocytes Relative: 32.7 % (ref 12.0–46.0)
Lymphs Abs: 2.2 10*3/uL (ref 0.7–4.0)
MCHC: 33.8 g/dL (ref 30.0–36.0)
MCV: 93.5 fl (ref 78.0–100.0)
Monocytes Absolute: 0.7 10*3/uL (ref 0.1–1.0)
Monocytes Relative: 10 % (ref 3.0–12.0)
Neutro Abs: 3.5 10*3/uL (ref 1.4–7.7)
Neutrophils Relative %: 53.6 % (ref 43.0–77.0)
Platelets: 186 10*3/uL (ref 150.0–400.0)
RBC: 3.94 Mil/uL (ref 3.87–5.11)
RDW: 13.1 % (ref 11.5–15.5)
WBC: 6.6 10*3/uL (ref 4.0–10.5)

## 2022-09-21 LAB — COMPREHENSIVE METABOLIC PANEL
ALT: 15 U/L (ref 0–35)
AST: 15 U/L (ref 0–37)
Albumin: 4.2 g/dL (ref 3.5–5.2)
Alkaline Phosphatase: 56 U/L (ref 39–117)
BUN: 22 mg/dL (ref 6–23)
CO2: 28 mEq/L (ref 19–32)
Calcium: 9.4 mg/dL (ref 8.4–10.5)
Chloride: 101 mEq/L (ref 96–112)
Creatinine, Ser: 0.72 mg/dL (ref 0.40–1.20)
GFR: 86.78 mL/min (ref >60.00)
Glucose, Bld: 90 mg/dL (ref 70–99)
Potassium: 4.7 mEq/L (ref 3.5–5.1)
Sodium: 137 mEq/L (ref 135–145)
Total Bilirubin: 0.4 mg/dL (ref 0.2–1.2)
Total Protein: 6.5 g/dL (ref 6.0–8.3)

## 2022-09-21 LAB — URINE CULTURE
MICRO NUMBER:: 14949672
SPECIMEN QUALITY:: ADEQUATE

## 2022-09-21 LAB — TSH: TSH: 2.25 u[IU]/mL (ref 0.35–5.50)

## 2022-09-21 LAB — VITAMIN D 25 HYDROXY (VIT D DEFICIENCY, FRACTURES): VITD: 15.96 ng/mL — ABNORMAL LOW (ref 30.00–100.00)

## 2022-09-21 NOTE — Assessment & Plan Note (Signed)
Repeat level needed  

## 2022-09-21 NOTE — Assessment & Plan Note (Addendum)
Ddx includes nephrolithiasis and less likely muscle spasm given normal physical exan.  Plain films of lumbar spine note scoliosis  and DDD without vertebral fracture. .  She has seen gross hematuria and her UA is positive for RBC's.  CT renal stone scan ordered. And negative for stones.

## 2022-09-21 NOTE — Progress Notes (Signed)
Subjective:  Patient ID: Kristi Coleman, female    DOB: [birth year]  Age: 67 y.o. MRN: [id number]  CC: The primary encounter diagnosis was Vitamin D deficiency. Diagnoses of Left lower quadrant abdominal pain, Constipation, unspecified constipation type, and Acute left flank pain were also pertinent to this visit.   HPI Kristi Coleman presents for  Chief Complaint  Patient presents with   Flank Pain   1) 67 yr old female with no history of osteoporosis or nephrolithiasis presents with 2 week history  of  recurrent left sided low back pain  that is described as non radiating, stabbing and severe at times.  She has seen "blood " in her urine but eats a lot of beets.  No fevers, , nausea or change in appetite.       2) chronic constipation:  feels like bowels are  incompletely  emptying ,  has recurrent episodes iof pressure in the LLQ   quadrant ,  stools alternate between large caliber and occasional  thin.   FH of colon CA in father.  Her Last colonoscopy 2021   reviewed:  internal hemorrhoids and sigmoid diverticulosis noted .  5 yr follow up advised.  She  has had hemorrhoids banded  due to recurrent bleeding,   which has not helped has occasional bleeding      Outpatient Medications Prior to Visit  Medication Sig Dispense Refill   aspirin 81 MG EC tablet Take 1 tablet by mouth daily. (Patient not taking: Reported on 02/01/2022)     gabapentin (NEURONTIN) 100 MG capsule gabapentin 100 mg capsule (Patient not taking: Reported on 02/01/2022)     meloxicam (MOBIC) 7.5 MG tablet meloxicam 7.5 mg tablet (Patient not taking: Reported on 02/01/2022)     ondansetron (ZOFRAN-ODT) 4 MG disintegrating tablet ondansetron 4 mg disintegrating tablet (Patient not taking: Reported on 02/01/2022)     oxyCODONE (OXY IR/ROXICODONE) 5 MG immediate release tablet oxycodone 5 mg tablet (Patient not taking: Reported on 02/01/2022)     predniSONE (DELTASONE) 10 MG tablet prednisone 10 mg tablet (Patient not taking:  Reported on 02/01/2022)     venlafaxine (EFFEXOR) 25 MG tablet Take 1 tablet by mouth in the morning and at bedtime. (Patient not taking: Reported on 02/01/2022)     No facility-administered medications prior to visit.    Review of Systems;  Patient denies headache, fevers, malaise, unintentional weight loss, skin rash, eye pain, sinus congestion and sinus pain, sore throat, dysphagia,  hemoptysis , cough, dyspnea, wheezing, chest pain, palpitations, orthopnea, edema, abdominal pain, nausea, melena, diarrhea, constipation, flank pain, dysuria, hematuria, urinary  Frequency, nocturia, numbness, tingling, seizures,  Focal weakness, Loss of consciousness,  Tremor, insomnia, depression, anxiety, and suicidal ideation.      Objective:  BP 136/76   Pulse 66   Temp 98.1 F (36.7 C) (Oral)   Ht 5\' 1"  (1.549 m)   Wt 114 lb 12.8 oz (52.1 kg)   SpO2 99%   BMI 21.69 kg/m   BP Readings from Last 3 Encounters:  09/21/22 136/76  02/01/22 138/81  12/29/21 115/70    Wt Readings from Last 3 Encounters:  09/21/22 114 lb 12.8 oz (52.1 kg)  02/01/22 114 lb (51.7 kg)  12/29/21 115 lb 3.2 oz (52.3 kg)    Physical Exam Vitals reviewed.  Constitutional:      General: She is not in acute distress.    Appearance: Normal appearance. She is normal weight. She is not ill-appearing, toxic-appearing or diaphoretic.  HENT:     Head: Normocephalic.  Eyes:     General: No scleral icterus.       Right eye: No discharge.        Left eye: No discharge.     Conjunctiva/sclera: Conjunctivae normal.  Cardiovascular:     Rate and Rhythm: Normal rate and regular rhythm.     Heart sounds: Normal heart sounds.  Pulmonary:     Effort: Pulmonary effort is normal. No respiratory distress.     Breath sounds: Normal breath sounds.  Musculoskeletal:        General: Normal range of motion.  Skin:    General: Skin is warm and dry.  Neurological:     General: No focal deficit present.     Mental Status: She is  alert and oriented to person, place, and time. Mental status is at baseline.  Psychiatric:        Mood and Affect: Mood normal.        Behavior: Behavior normal.        Thought Content: Thought content normal.        Judgment: Judgment normal.    No results found for: "HGBA1C"  Lab Results  Component Value Date   CREATININE 0.80 06/10/2020   CREATININE 0.96 10/24/2017   CREATININE 0.77 07/08/2015    Lab Results  Component Value Date   WBC 6.3 06/10/2020   HGB 12.1 06/10/2020   HCT 35.7 (L) 06/10/2020   PLT 175.0 06/10/2020   GLUCOSE 81 06/10/2020   CHOL 216 (H) 06/10/2020   TRIG 58.0 06/10/2020   HDL 86.00 06/10/2020   LDLCALC 118 (H) 06/10/2020   ALT 20 06/10/2020   AST 19 06/10/2020   NA 137 06/10/2020   K 4.4 06/10/2020   CL 102 06/10/2020   CREATININE 0.80 06/10/2020   BUN 19 06/10/2020   CO2 27 06/10/2020   TSH 2.66 06/10/2020    No results found.  Assessment & Plan:  .Vitamin D deficiency Assessment & Plan: Repeat level needed   Orders: -     VITAMIN D 25 Hydroxy (Vit-D Deficiency, Fractures)  Left lower quadrant abdominal pain -     Comprehensive metabolic panel -     CBC with Differential/Platelet  Constipation, unspecified constipation type -     TSH  Acute left flank pain Assessment & Plan: Ddx includes nephrolithiasis and less likely muscle spasm given normal physical exan.  Plain films of lumbar spine note scoliosis  and DDD without vertebral fracture. .  She has seen gross hematuria and her UA is positive for RBC's.  CT renal stone scan ordered.       I provided   26 minutes of face-to-face time during this encounter reviewing patient's last visit with me, patient's  most recent  surgical and non surgical procedures, previous  labs and imaging studies, counseling on currently addressed issues,  and post visit ordering to diagnostics and therapeutics .   Follow-up: No follow-ups on file.   Sherlene Shams, MD

## 2022-09-21 NOTE — Patient Instructions (Addendum)
Start using Benefiber  daily in a beverage     Try to increase water  to 60 ounces daily ( to account  for the caffeinated beverages )  CT scan to be scheduled in next 24 hours

## 2022-09-22 ENCOUNTER — Telehealth: Payer: Self-pay

## 2022-09-22 MED ORDER — ERGOCALCIFEROL 1.25 MG (50000 UT) PO CAPS: 50000.0000 [IU] | ORAL_CAPSULE | ORAL | 1 refills | Status: DC

## 2022-09-22 NOTE — Addendum Note (Signed)
Addended by: Sherlene Shams on: 09/22/2022 12:54 PM   Modules accepted: Orders

## 2022-09-22 NOTE — Telephone Encounter (Signed)
error 

## 2022-09-27 ENCOUNTER — Other Ambulatory Visit (INDEPENDENT_AMBULATORY_CARE_PROVIDER_SITE_OTHER): Payer: 59

## 2022-09-27 DIAGNOSIS — R311 Benign essential microscopic hematuria: Secondary | ICD-10-CM | POA: Diagnosis not present

## 2022-09-27 LAB — URINALYSIS, ROUTINE W REFLEX MICROSCOPIC
Bilirubin Urine: NEGATIVE
Ketones, ur: NEGATIVE
Leukocytes,Ua: NEGATIVE
Nitrite: NEGATIVE
Specific Gravity, Urine: 1.015 (ref 1.000–1.030)
Total Protein, Urine: NEGATIVE
Urine Glucose: NEGATIVE
Urobilinogen, UA: 0.2 (ref 0.0–1.0)
pH: 6 (ref 5.0–8.0)

## 2022-11-08 ENCOUNTER — Other Ambulatory Visit: Payer: Self-pay | Admitting: Internal Medicine

## 2022-11-08 MED ORDER — CIPROFLOXACIN HCL 500 MG PO TABS: 500.0000 mg | ORAL_TABLET | Freq: Two times a day (BID) | ORAL | 0 refills | Status: AC

## 2022-11-10 ENCOUNTER — Other Ambulatory Visit: Payer: Self-pay | Admitting: Internal Medicine

## 2022-11-10 DIAGNOSIS — A09 Infectious gastroenteritis and colitis, unspecified: Secondary | ICD-10-CM | POA: Insufficient documentation

## 2022-11-10 MED ORDER — SULFAMETHOXAZOLE-TRIMETHOPRIM 800-160 MG PO TABS: 1.0000 | ORAL_TABLET | Freq: Two times a day (BID) | ORAL | 0 refills | Status: AC

## 2022-11-10 NOTE — Assessment & Plan Note (Signed)
Likely cyclospora given positive pcr test on her relative who is also affected.  Treating with septra DS

## 2022-12-21 DIAGNOSIS — M79645 Pain in left finger(s): Secondary | ICD-10-CM | POA: Diagnosis not present

## 2023-01-04 DIAGNOSIS — M79645 Pain in left finger(s): Secondary | ICD-10-CM | POA: Diagnosis not present

## 2023-01-20 ENCOUNTER — Encounter: Payer: Self-pay | Admitting: Internal Medicine

## 2023-01-20 NOTE — Telephone Encounter (Signed)
Kristi Coleman has been scheduled for and appt tomorrow.

## 2023-06-01 DIAGNOSIS — M79645 Pain in left finger(s): Secondary | ICD-10-CM | POA: Diagnosis not present

## 2023-06-01 DIAGNOSIS — M65312 Trigger thumb, left thumb: Secondary | ICD-10-CM | POA: Diagnosis not present

## 2023-06-20 DIAGNOSIS — M79645 Pain in left finger(s): Secondary | ICD-10-CM | POA: Diagnosis not present

## 2023-06-20 DIAGNOSIS — M65312 Trigger thumb, left thumb: Secondary | ICD-10-CM | POA: Diagnosis not present

## 2023-07-18 DIAGNOSIS — M65312 Trigger thumb, left thumb: Secondary | ICD-10-CM | POA: Diagnosis not present

## 2023-07-18 DIAGNOSIS — M79645 Pain in left finger(s): Secondary | ICD-10-CM | POA: Diagnosis not present

## 2023-07-19 ENCOUNTER — Other Ambulatory Visit: Payer: Self-pay

## 2023-07-19 ENCOUNTER — Ambulatory Visit
Admission: EM | Admit: 2023-07-19 | Discharge: 2023-07-19 | Disposition: A | Discharge status: Home / Self Care | Attending: Emergency Medicine | Admitting: Emergency Medicine

## 2023-07-19 ENCOUNTER — Encounter: Payer: Self-pay | Admitting: Emergency Medicine

## 2023-07-19 DIAGNOSIS — S61211A Laceration without foreign body of left index finger without damage to nail, initial encounter: Secondary | ICD-10-CM

## 2023-07-19 DIAGNOSIS — Z23 Encounter for immunization: Secondary | ICD-10-CM | POA: Diagnosis not present

## 2023-07-19 MED ORDER — TETANUS-DIPHTH-ACELL PERTUSSIS 5-2.5-18.5 LF-MCG/0.5 IM SUSY
0.5000 mL | PREFILLED_SYRINGE | Freq: Once | INTRAMUSCULAR | Status: AC
  Administered 2023-07-19: 0.5 mL via INTRAMUSCULAR

## 2023-07-19 NOTE — ED Triage Notes (Signed)
 Patient presents to [facility] for evaluation of laceration to pointer finger of left hand.  Patient says she plays guitar.  Cut it cutting cabbage.  Last tetanus unknown

## 2023-07-19 NOTE — ED Provider Notes (Signed)
 Kristi Coleman    CSN: [id number] Arrival date & time: 07/19/23  1913      History   Chief Complaint Chief Complaint  Patient presents with   Laceration    HPI Kristi Coleman is a 68 y.o. female.   Presents for evaluation of a laceration occurring to the left index finger beginning 1 hour ago.  Was cutting cabbage and home, offending agent kitchen knife.  Has rinsed with water and covered with 3 bandages to try to control bleeding.  Able to complete range of motion.  Denies numbness or tingling.  Past Medical History:  Diagnosis Date   Asthma    hx of in past   Baker's cyst of knee, right    Constipation    Degenerative arthritis of lumbar spine    Family history of adverse reaction to anesthesia    sister and mom experienced nausea   Menopause    Motion sickness    Uterine fibroid     Patient Active Problem List   Diagnosis Date Noted   Infectious diarrhea 11/10/2022   Acute left flank pain 09/21/2022   Traumatic complete tear of left rotator cuff 09/05/2020   Vitamin D deficiency 06/10/2020   Family history of colon cancer requiring screening colonoscopy 10/30/2018   Abnormal mammogram of left breast 12/05/2017   History of uterine fibroid 10/25/2017   Encounter for preventive health examination 10/25/2017   History of meniscal tear 10/24/2017   Pulmonary nodules/lesions, multiple 01/25/2016   Lateral epicondylitis 02/16/2012    Past Surgical History:  Procedure Laterality Date   ANAL SPHINCTEROTOMY  2004   [place]   COLONOSCOPY WITH PROPOFOL N/A 10/05/2019   Procedure: COLONOSCOPY WITH PROPOFOL;  Surgeon: Midge Minium, MD;  Location: [facility];  Service: Endoscopy;  Laterality: N/A;   EXCISIONAL HEMORRHOIDECTOMY  2005   [place]    OB History   No obstetric history on file.      Home Medications    Prior to Admission medications   Medication Sig Start Date End Date Taking? Authorizing Provider  ergocalciferol (DRISDOL) 1.25 MG  (50000 UT) capsule Take 1 capsule (50,000 Units total) by mouth once a week. 09/22/22   Sherlene Shams, MD    Family History Family History  Problem Relation Age of Onset   Cancer Other    Breast cancer Maternal Uncle    Heart disease Other        In some men in her family   Arthritis Mother    Diabetes Mother    Arthritis Father    Colon cancer Father    Hyperlipidemia Brother    Hypertension Brother    Hypertension Brother    Hyperlipidemia Brother    Breast cancer Maternal Grandmother     Social History Social History   Tobacco Use   Smoking status: Never   Smokeless tobacco: Never   Tobacco comments:    Tobacco use-no  Vaping Use   Vaping status: Never Used  Substance Use Topics   Alcohol use: Yes    Comment: 1-2 drinks of vodka and ginger ale nightly   Drug use: No     Allergies   Ibuprofen and Codeine   Review of Systems Review of Systems   Physical Exam Triage Vital Signs ED Triage Vitals  Encounter Vitals Group     BP 07/19/23 1921 (!) 154/92     Systolic BP Percentile --      Diastolic BP Percentile --  Pulse Rate 07/19/23 1921 67     Resp 07/19/23 1921 18     Temp 07/19/23 1921 98 F (36.7 C)     Temp Source 07/19/23 1921 Temporal     SpO2 07/19/23 1921 96 %     Weight --      Height --      Head Circumference --      Peak Flow --      Pain Score 07/19/23 1922 2     Pain Loc --      Pain Education --      Exclude from Growth Chart --    No data found.  Updated Vital Signs BP (!) 154/92 (BP Location: Right Arm)   Pulse 67   Temp 98 F (36.7 C) (Temporal)   Resp 18   SpO2 96%   Visual Acuity Right Eye Distance:   Left Eye Distance:   Bilateral Distance:    Right Eye Near:   Left Eye Near:    Bilateral Near:     Physical Exam Constitutional:      Appearance: Normal appearance.  Eyes:     Extraocular Movements: Extraocular movements intact.  Pulmonary:     Effort: Pulmonary effort is normal.  Skin:    Comments: 1  cm laceration present to the palmar aspect of the distal phalanx of the left index finger, no involvement of the DIP joint or nail, able to complete range of motion, capillary refill less than 3, sensation intact  Neurological:     Mental Status: She is alert and oriented to person, place, and time. Mental status is at baseline.      UC Treatments / Results  Labs (all labs ordered are listed, but only abnormal results are displayed) Labs Reviewed - No data to display  EKG   Radiology No results found.  Procedures Laceration Repair  Date/Time: 07/19/2023 7:43 PM  Performed by: Valinda Hoar, NP Authorized by: Valinda Hoar, NP   Consent:    Consent obtained:  Verbal   Consent given by:  Patient   Risks, benefits, and alternatives were discussed: yes     Risks discussed:  Pain and infection Universal protocol:    Procedure explained and questions answered to patient or proxy's satisfaction: yes     Patient identity confirmed:  Verbally with patient Anesthesia:    Anesthesia method:  None Laceration details:    Location:  Finger   Finger location:  L index finger   Length (cm):  1   Depth (mm):  0.5 Exploration:    Wound exploration: entire depth of wound visualized   Treatment:    Area cleansed with:  Chlorhexidine   Amount of cleaning:  Standard   Irrigation method:  Tap Skin repair:    Repair method:  Tissue adhesive Approximation:    Approximation:  Close Repair type:    Repair type:  Simple Post-procedure details:    Dressing:  Non-adherent dressing   Procedure completion:  Tolerated  (including critical care time)  Medications Ordered in UC Medications  Tdap (BOOSTRIX) injection 0.5 mL (0.5 mLs Intramuscular Given 07/19/23 1929)    Initial Impression / Assessment and Plan / UC Course  I have reviewed the triage vital signs and the nursing notes.  Pertinent labs & imaging results that were available during my care of the patient were reviewed  by me and considered in my medical decision making (see chart for details).  Aspiration of left index finger without foreign  body without damage to nail  Laceration closed with tissue adhesive, tetanus updated, advised daily cleansing and monitoring for signs of infection, advise follow-up for any concerns Final Clinical Impressions(s) / UC Diagnoses   Final diagnoses:  Laceration of left index finger without foreign body without damage to nail, initial encounter     Discharge Instructions      You were evaluated for your laceration  Able to adhere laceration with tissue adhesive, this will fall off naturally, do not peel  Wound has already begun to heal itself and most likely will be fully healed by then the week  Cleanse daily with soap and water, pat and do not rub  Keep area covered with Band-Aid to keep as clean as possible, if outside doing yard work please cover with Band-Aid and a glove  Tetanus shot has been updated, good for 10 years  If you begin to notice puslike drainage, new redness, swelling or increased pain please follow-up for reevaluation as these are signs of infection   ED Prescriptions   None    PDMP not reviewed this encounter.   Valinda Hoar, NP 07/19/23 1944

## 2023-07-19 NOTE — Discharge Instructions (Signed)
 You were evaluated for your laceration  Able to adhere laceration with tissue adhesive, this will fall off naturally, do not peel  Wound has already begun to heal itself and most likely will be fully healed by then the week  Cleanse daily with soap and water, pat and do not rub  Keep area covered with Band-Aid to keep as clean as possible, if outside doing yard work please cover with Band-Aid and a glove  Tetanus shot has been updated, good for 10 years  If you begin to notice puslike drainage, new redness, swelling or increased pain please follow-up for reevaluation as these are signs of infection

## 2023-08-01 DIAGNOSIS — D2262 Melanocytic nevi of left upper limb, including shoulder: Secondary | ICD-10-CM | POA: Diagnosis not present

## 2023-08-01 DIAGNOSIS — L308 Other specified dermatitis: Secondary | ICD-10-CM | POA: Diagnosis not present

## 2023-08-01 DIAGNOSIS — L821 Other seborrheic keratosis: Secondary | ICD-10-CM | POA: Diagnosis not present

## 2023-08-01 DIAGNOSIS — L03032 Cellulitis of left toe: Secondary | ICD-10-CM | POA: Diagnosis not present

## 2023-08-01 DIAGNOSIS — D2272 Melanocytic nevi of left lower limb, including hip: Secondary | ICD-10-CM | POA: Diagnosis not present

## 2023-08-01 DIAGNOSIS — D2261 Melanocytic nevi of right upper limb, including shoulder: Secondary | ICD-10-CM | POA: Diagnosis not present

## 2023-08-01 DIAGNOSIS — D225 Melanocytic nevi of trunk: Secondary | ICD-10-CM | POA: Diagnosis not present

## 2023-08-01 DIAGNOSIS — D2271 Melanocytic nevi of right lower limb, including hip: Secondary | ICD-10-CM | POA: Diagnosis not present

## 2023-08-23 DIAGNOSIS — M7062 Trochanteric bursitis, left hip: Secondary | ICD-10-CM | POA: Diagnosis not present

## 2023-09-20 ENCOUNTER — Other Ambulatory Visit: Payer: Self-pay | Admitting: Internal Medicine

## 2023-09-20 DIAGNOSIS — Z1231 Encounter for screening mammogram for malignant neoplasm of breast: Secondary | ICD-10-CM

## 2023-09-27 ENCOUNTER — Ambulatory Visit
Admission: RE | Admit: 2023-09-27 | Discharge: 2023-09-27 | Disposition: A | Discharge status: Home / Self Care | Source: Ambulatory Visit | Attending: Internal Medicine | Admitting: Internal Medicine

## 2023-09-27 DIAGNOSIS — Z1231 Encounter for screening mammogram for malignant neoplasm of breast: Secondary | ICD-10-CM | POA: Diagnosis not present

## 2023-09-28 ENCOUNTER — Other Ambulatory Visit: Payer: Self-pay | Admitting: Internal Medicine

## 2023-09-28 ENCOUNTER — Telehealth: Payer: Self-pay | Admitting: Internal Medicine

## 2023-09-28 DIAGNOSIS — D259 Leiomyoma of uterus, unspecified: Secondary | ICD-10-CM

## 2023-09-28 DIAGNOSIS — Z86018 Personal history of other benign neoplasm: Secondary | ICD-10-CM

## 2023-09-28 MED ORDER — DIAZEPAM 5 MG PO TABS: 5.0000 mg | ORAL_TABLET | Freq: Two times a day (BID) | ORAL | 0 refills | Status: DC | PRN

## 2023-09-28 MED ORDER — LACTULOSE 10 GM/15ML PO SOLN: 20.0000 g | Freq: Every day | ORAL | 0 refills | Status: DC | PRN

## 2023-09-28 NOTE — Telephone Encounter (Signed)
 Copied from CRM [id number]. Topic: Referral - Request for Referral >> Sep 28, 2023 12:09 PM Albertha Alosa wrote: Did the patient discuss referral with their provider in the last year? Yes (If No - schedule appointment) (If Yes - send message)  Appointment offered? Yes  Type of order/referral and detailed reason for visit: Ultrasound for fibroids  Preference of office, provider, location: [facility]  If referral order, have you been seen by this specialty before? No (If Yes, this issue or another issue? When? Where?  Can we respond through MyChart? Yes

## 2023-09-28 NOTE — Assessment & Plan Note (Signed)
 Patient has had worsening constipation and is concerned that her fibroids have grown larger. . Pelvic ultrasound ordered

## 2023-10-04 ENCOUNTER — Other Ambulatory Visit: Payer: Self-pay | Admitting: Internal Medicine

## 2023-10-04 ENCOUNTER — Ambulatory Visit
Admission: RE | Admit: 2023-10-04 | Discharge: 2023-10-04 | Disposition: A | Discharge status: Home / Self Care | Source: Ambulatory Visit | Attending: Internal Medicine | Admitting: Internal Medicine

## 2023-10-04 DIAGNOSIS — D259 Leiomyoma of uterus, unspecified: Secondary | ICD-10-CM | POA: Diagnosis not present

## 2023-10-04 DIAGNOSIS — Z86018 Personal history of other benign neoplasm: Secondary | ICD-10-CM

## 2023-10-04 DIAGNOSIS — R140 Abdominal distension (gaseous): Secondary | ICD-10-CM | POA: Diagnosis not present

## 2023-10-13 ENCOUNTER — Ambulatory Visit: Payer: Self-pay | Admitting: Internal Medicine

## 2023-10-13 DIAGNOSIS — K921 Melena: Secondary | ICD-10-CM

## 2023-10-13 DIAGNOSIS — R195 Other fecal abnormalities: Secondary | ICD-10-CM

## 2023-10-18 NOTE — Addendum Note (Signed)
 Addended by: Thersia Flax on: 10/18/2023 07:39 PM   Modules accepted: Orders

## 2023-10-21 ENCOUNTER — Ambulatory Visit
Admission: RE | Admit: 2023-10-21 | Discharge: 2023-10-21 | Disposition: A | Discharge status: Home / Self Care | Source: Ambulatory Visit | Attending: Internal Medicine | Admitting: Internal Medicine

## 2023-10-21 DIAGNOSIS — D259 Leiomyoma of uterus, unspecified: Secondary | ICD-10-CM | POA: Diagnosis not present

## 2023-10-21 DIAGNOSIS — R195 Other fecal abnormalities: Secondary | ICD-10-CM

## 2023-10-21 DIAGNOSIS — K921 Melena: Secondary | ICD-10-CM

## 2023-10-21 MED ORDER — IOPAMIDOL (ISOVUE-300) INJECTION 61%
100.0000 mL | Freq: Once | INTRAVENOUS | Status: AC | PRN
  Administered 2023-10-21: 80 mL via INTRAVENOUS

## 2023-10-22 ENCOUNTER — Ambulatory Visit: Payer: Self-pay | Admitting: Internal Medicine

## 2023-11-07 DIAGNOSIS — K120 Recurrent oral aphthae: Secondary | ICD-10-CM | POA: Diagnosis not present

## 2023-11-08 DIAGNOSIS — J305 Allergic rhinitis due to food: Secondary | ICD-10-CM | POA: Diagnosis not present

## 2023-11-14 DIAGNOSIS — R140 Abdominal distension (gaseous): Secondary | ICD-10-CM | POA: Diagnosis not present

## 2023-11-14 DIAGNOSIS — K644 Residual hemorrhoidal skin tags: Secondary | ICD-10-CM | POA: Diagnosis not present

## 2023-11-14 DIAGNOSIS — D259 Leiomyoma of uterus, unspecified: Secondary | ICD-10-CM | POA: Diagnosis not present

## 2023-11-14 DIAGNOSIS — N952 Postmenopausal atrophic vaginitis: Secondary | ICD-10-CM | POA: Diagnosis not present

## 2023-11-28 NOTE — Progress Notes (Signed)
 Ellouise Console, PA-C [address] [place], [place]  [address] Phone: [phone]   Gastroenterology Consultation  Referring Provider:     Marylynn Verneita CROME, MD Primary Care Physician:  Marylynn Verneita CROME, MD Primary Gastroenterologist:  Ellouise Console, PA-C / Glendia Holt, MD  Reason for Consultation:     Rectal bleeding, discuss colonoscopy        HPI:   Kristi Coleman is a 68 y.o. y/o female referred for consultation & management  by Marylynn Verneita CROME, MD. She is referred to evaluate rectal bleeding and discuss repeat colonoscopy.  Established patient at [facility].  She underwent internal hemorrhoid banding by Dr. Therisa in 2023. Underwent excisional hemorrhoidectomy surgery 2005.  Current symptoms:  Has history of chronic constipation and hemorrhoids.  Patient states she is seeing rectal bleeding every morning which she attributes to hemorrhoids.  She is having a soft bowel movement every morning.  She is having thin stools and a feeling of incomplete bowel evacuation.  Occasionally has to strain to get the bowel movement out.  She reports occasional generalized abdominal pain if she does not have a bowel movement.  She drinks a lot of water .  Admits to abdominal bloating.  She denies unintentional weight loss.  Eats a lot of vegetables, Mediterranean diet, very healthy diet.  Her father had colon cancer in his 48s.  Patient is requesting to schedule repeat colonoscopy due to her rectal bleeding and family history.  09/2019 Colonoscopy by Dr. Jinny:  No polyps but she did have grade 2 internal hemorrhoids.   12/2013 colonoscopy by Dr. Viktoria: Small internal hemorrhoids.  No polyps.  Mild diverticulosis.  Excellent prep.  5-year repeat due to family history of colon cancer in first-degree relative.  Past Medical History:  Diagnosis Date   Asthma    hx of in past   Baker's cyst of knee, right    Constipation    Degenerative arthritis of lumbar spine    Family history of adverse  reaction to anesthesia    sister and mom experienced nausea   Menopause    Motion sickness    Uterine fibroid     Past Surgical History:  Procedure Laterality Date   ANAL SPHINCTEROTOMY  05/10/2002   elliott   COLONOSCOPY WITH PROPOFOL  N/A 10/05/2019   Procedure: COLONOSCOPY WITH PROPOFOL ;  Surgeon: Jinny Carmine, MD;  Location: [facility];  Service: Endoscopy;  Laterality: N/A;   EXCISIONAL HEMORRHOIDECTOMY  05/11/2003   Byrnett   ROTATOR CUFF REPAIR  2022    Prior to Admission medications   Medication Sig Start Date End Date Taking? Authorizing Provider  diazepam  (VALIUM ) 5 MG tablet Take 1 tablet (5 mg total) by mouth every 12 (twelve) hours as needed for anxiety. 09/28/23   Marylynn Verneita CROME, MD  ergocalciferol  (DRISDOL ) 1.25 MG (50000 UT) capsule Take 1 capsule (50,000 Units total) by mouth once a week. 09/22/22   Marylynn Verneita CROME, MD  lactulose  (CHRONULAC ) 10 GM/15ML solution Take 30 mLs (20 g total) by mouth daily as needed for mild constipation. constipation 09/28/23   Marylynn Verneita CROME, MD    Family History  Problem Relation Age of Onset   Cancer Other    Breast cancer Maternal Uncle    Heart disease Other        In some men in her family   Arthritis Mother    Diabetes Mother    Arthritis Father    Colon cancer Father  Hyperlipidemia Brother    Hypertension Brother    Hypertension Brother    Hyperlipidemia Brother    Breast cancer Maternal Grandmother      Social History   Tobacco Use   Smoking status: Never   Smokeless tobacco: Never   Tobacco comments:    Tobacco use-no  Vaping Use   Vaping status: Never Used  Substance Use Topics   Alcohol use: Yes    Comment: 1-2 drinks of vodka and ginger ale nightly   Drug use: No    Allergies as of 11/29/2023 - Review Complete 11/29/2023  Allergen Reaction Noted   Ibuprofen Swelling 10/17/2013   Codeine Nausea Only 01/28/2009    Review of Systems:    All systems reviewed and negative except where noted  in HPI.   Physical Exam:  BP 106/68   Pulse (!) 123   Ht 5' 1 (1.549 m)   Wt 116 lb (52.6 kg)   BMI 21.92 kg/m  No LMP recorded. Patient is postmenopausal.  General:   Alert,  Well-developed, well-nourished, pleasant and cooperative in NAD Lungs:  Respirations even and unlabored.  Clear throughout to auscultation.   No wheezes, crackles, or rhonchi. No acute distress. Heart:  Regular rate and rhythm; no murmurs, clicks, rubs, or gallops. Abdomen:  Normal bowel sounds.  No bruits.  Soft, and non-distended without masses, hepatosplenomegaly or hernias noted.  No Tenderness.  No guarding or rebound tenderness.    Rectal: Several external hemorrhoids which are not swollen, not thrombosed, not tender, not bleeding.  Normal anal sphincter tone.  No rectal masses.  Soft brown stool, Hemoccult positive.  No rectal tenderness.  No anal fissure. Neurologic:  Alert and oriented x3;  grossly normal neurologically. Psych:  Alert and cooperative. Normal mood and affect.  Chaperone for Exam:  Alethea Blocker, CMA   Imaging Studies: No results found.  Labs: CBC    Component Value Date/Time   WBC 6.6 09/21/2022 1008   RBC 3.94 09/21/2022 1008   HGB 12.5 09/21/2022 1008   HCT 36.9 09/21/2022 1008   PLT 186.0 09/21/2022 1008   MCV 93.5 09/21/2022 1008   MCHC 33.8 09/21/2022 1008   RDW 13.1 09/21/2022 1008   LYMPHSABS 2.2 09/21/2022 1008   MONOABS 0.7 09/21/2022 1008   EOSABS 0.2 09/21/2022 1008   BASOSABS 0.0 09/21/2022 1008    CMP     Component Value Date/Time   NA 137 09/21/2022 1008   K 4.7 09/21/2022 1008   CL 101 09/21/2022 1008   CO2 28 09/21/2022 1008   GLUCOSE 90 09/21/2022 1008   BUN 22 09/21/2022 1008   CREATININE 0.72 09/21/2022 1008   CALCIUM 9.4 09/21/2022 1008   PROT 6.5 09/21/2022 1008   ALBUMIN 4.2 09/21/2022 1008   AST 15 09/21/2022 1008   ALT 15 09/21/2022 1008   ALKPHOS 56 09/21/2022 1008   BILITOT 0.4 09/21/2022 1008    Assessment and Plan:   Kristi Coleman is a 68 y.o. y/o female has been referred for:  1.  Internal and External hemorrhoids - Rx hydrocortisone  2.5% cream apply twice daily for 2 weeks, number 30 g, 1 refill. - Use Tucks pads, which tables or wipes, wet to baby wipes. - Avoid hard stools and straining. -Stressed the importance of treating underlying constipation. - She has had previous internal hemorrhoid banding in 2023 and hemorrhoidectomy in 2005. - If hemorrhoids continue to be symptomatic, may need to refer back to general surgeon for surgical opinion.  Otherwise  continue conservative treatment.  2.  Rectal bleeding - CBC - Scheduling Colonoscopy I discussed risks of colonoscopy with patient to include risk of bleeding, colon perforation, and risk of sedation.  Patient expressed understanding and agrees to proceed with colonoscopy.   3.  Constipation - Start MiraLAX 1 capful in a drink once daily. - Also start Benefiber powder 1 tablespoon in the same drink once daily. - Recommend High Fiber diet with fruits, vegetables, and whole grains. - Drink 64 ounces of Fluids Daily.   4.  Family history of colon cancer - Father age 26s - Discussed colon cancer screening guidelines. - Repeat colonoscopy every 5 years due to family history.  Follow up 4 weeks after colonoscopy with TG.  Ellouise Console, PA-C

## 2023-11-29 ENCOUNTER — Other Ambulatory Visit (INDEPENDENT_AMBULATORY_CARE_PROVIDER_SITE_OTHER)

## 2023-11-29 ENCOUNTER — Encounter: Payer: Self-pay | Admitting: Gastroenterology

## 2023-11-29 ENCOUNTER — Ambulatory Visit: Payer: Self-pay | Admitting: Physician Assistant

## 2023-11-29 ENCOUNTER — Encounter: Payer: Self-pay | Admitting: Physician Assistant

## 2023-11-29 ENCOUNTER — Ambulatory Visit: Admitting: Physician Assistant

## 2023-11-29 VITALS — BP 106/68 | HR 123 | Ht 61.0 in | Wt 116.0 lb

## 2023-11-29 DIAGNOSIS — K625 Hemorrhage of anus and rectum: Secondary | ICD-10-CM | POA: Diagnosis not present

## 2023-11-29 DIAGNOSIS — K648 Other hemorrhoids: Secondary | ICD-10-CM | POA: Diagnosis not present

## 2023-11-29 DIAGNOSIS — K5900 Constipation, unspecified: Secondary | ICD-10-CM

## 2023-11-29 DIAGNOSIS — Z800 Family history of malignant neoplasm of digestive organs: Secondary | ICD-10-CM

## 2023-11-29 DIAGNOSIS — K649 Unspecified hemorrhoids: Secondary | ICD-10-CM

## 2023-11-29 DIAGNOSIS — K644 Residual hemorrhoidal skin tags: Secondary | ICD-10-CM | POA: Diagnosis not present

## 2023-11-29 DIAGNOSIS — K5904 Chronic idiopathic constipation: Secondary | ICD-10-CM

## 2023-11-29 LAB — CBC WITH DIFFERENTIAL/PLATELET
Basophils Absolute: 0.1 K/uL (ref 0.0–0.1)
Basophils Relative: 0.9 % (ref 0.0–3.0)
Eosinophils Absolute: 0.3 K/uL (ref 0.0–0.7)
Eosinophils Relative: 4.9 % (ref 0.0–5.0)
HCT: 37.3 % (ref 36.0–46.0)
Hemoglobin: 12.7 g/dL (ref 12.0–15.0)
Lymphocytes Relative: 32.1 % (ref 12.0–46.0)
Lymphs Abs: 2.1 K/uL (ref 0.7–4.0)
MCHC: 33.9 g/dL (ref 30.0–36.0)
MCV: 92.4 fl (ref 78.0–100.0)
Monocytes Absolute: 0.6 K/uL (ref 0.1–1.0)
Monocytes Relative: 8.9 % (ref 3.0–12.0)
Neutro Abs: 3.5 K/uL (ref 1.4–7.7)
Neutrophils Relative %: 53.2 % (ref 43.0–77.0)
Platelets: 189 K/uL (ref 150.0–400.0)
RBC: 4.04 Mil/uL (ref 3.87–5.11)
RDW: 12.8 % (ref 11.5–15.5)
WBC: 6.5 K/uL (ref 4.0–10.5)

## 2023-11-29 MED ORDER — NA SULFATE-K SULFATE-MG SULF 17.5-3.13-1.6 GM/177ML PO SOLN: 1.0000 | Freq: Once | ORAL | 0 refills | Status: AC

## 2023-11-29 MED ORDER — HYDROCORTISONE (PERIANAL) 2.5 % EX CREA: 1.0000 | TOPICAL_CREAM | Freq: Two times a day (BID) | CUTANEOUS | 1 refills | Status: AC

## 2023-11-29 NOTE — Patient Instructions (Addendum)
 Your provider has requested that you go to the basement level for lab work before leaving today. Press B on the elevator. The lab is located at the first door on the left as you exit the elevator.    For constipation: Start OTC Miralax Powder Mix 1 capful in 6 to 8 ounces of a drink once daily  Start OTC Benefiber Powder. Mix 1 Tablespoons in 6 - 8 ounces of a Drink Once Daily.  Recommend high-fiber diet, 30 g of fiber daily Eat fruits, vegetables, and whole grains Drink 64 ounces of water  / fluids daily.   We have sent the following medications to your pharmacy for you to pick up at your convenience: Hydrocortisone  Cream   You have been scheduled for a Colonoscopy. Please follow written instructions given to you at your visit today.   If you use inhalers (even only as needed), please bring them with you on the day of your procedure.  DO NOT TAKE 7 DAYS PRIOR TO TEST- Trulicity (dulaglutide) Ozempic, Wegovy (semaglutide) Mounjaro (tirzepatide) Bydureon Bcise (exanatide extended release)  DO NOT TAKE 1 DAY PRIOR TO YOUR TEST Rybelsus (semaglutide) Adlyxin (lixisenatide) Victoza (liraglutide) Byetta (exanatide) ___________________________________________________________________________  Please follow up sooner if symptoms increase or worsen   Due to recent changes in healthcare laws, you may see the results of your imaging and laboratory studies on MyChart before your provider has had a chance to review them.  We understand that in some cases there may be results that are confusing or concerning to you. Not all laboratory results come back in the same time frame and the provider may be waiting for multiple results in order to interpret others.  Please give us  48 hours in order for your provider to thoroughly review all the results before contacting the office for clarification of your results.   Thank you for trusting me with your gastrointestinal care!   Ellouise Console,  PA-C _______________________________________________________  If your blood pressure at your visit was 140/90 or greater, please contact your primary care physician to follow up on this.  _______________________________________________________  If you are age 45 or older, your body mass index should be between 23-30. Your Body mass index is 21.92 kg/m. If this is out of the aforementioned range listed, please consider follow up with your Primary Care Provider.  If you are age 23 or younger, your body mass index should be between 19-25. Your Body mass index is 21.92 kg/m. If this is out of the aformentioned range listed, please consider follow up with your Primary Care Provider.   ________________________________________________________  The  GI providers would like to encourage you to use MYCHART to communicate with providers for non-urgent requests or questions.  Due to long hold times on the telephone, sending your provider a message by [facility] may be a faster and more efficient way to get a response.  Please allow 48 business hours for a response.  Please remember that this is for non-urgent requests.  _______________________________________________________

## 2023-12-01 ENCOUNTER — Encounter: Payer: Self-pay | Admitting: Gastroenterology

## 2023-12-01 ENCOUNTER — Ambulatory Visit (AMBULATORY_SURGERY_CENTER): Admitting: Gastroenterology

## 2023-12-01 VITALS — BP 123/76 | HR 74 | Temp 98.6°F | Resp 15 | Ht 61.0 in | Wt 116.0 lb

## 2023-12-01 DIAGNOSIS — K640 First degree hemorrhoids: Secondary | ICD-10-CM

## 2023-12-01 DIAGNOSIS — K644 Residual hemorrhoidal skin tags: Secondary | ICD-10-CM

## 2023-12-01 DIAGNOSIS — K625 Hemorrhage of anus and rectum: Secondary | ICD-10-CM

## 2023-12-01 DIAGNOSIS — Z800 Family history of malignant neoplasm of digestive organs: Secondary | ICD-10-CM | POA: Diagnosis not present

## 2023-12-01 DIAGNOSIS — K5730 Diverticulosis of large intestine without perforation or abscess without bleeding: Secondary | ICD-10-CM | POA: Diagnosis not present

## 2023-12-01 MED ORDER — SODIUM CHLORIDE 0.9 % IV SOLN: 500.0000 mL | Freq: Once | INTRAVENOUS | Status: DC

## 2023-12-01 NOTE — Op Note (Signed)
 [facility] Patient Name: Kristi Coleman Procedure Date: 12/01/2023 9:51 AM MRN: [id number] Endoscopist: Glendia E. Stacia , MD, [id number] Age: 68 Referring MD:  Date of Birth: [birth year] Gender: Female Account #: [id number] Procedure:                Colonoscopy Indications:              Rectal bleeding, family history of colon cancer                            (father, 26s) Medicines:                Monitored Anesthesia Care Procedure:                Pre-Anesthesia Assessment:                           - Prior to the procedure, a History and Physical                            was performed, and patient medications and                            allergies were reviewed. The patient's tolerance of                            previous anesthesia was also reviewed. The risks                            and benefits of the procedure and the sedation                            options and risks were discussed with the patient.                            All questions were answered, and informed consent                            was obtained. Prior Anticoagulants: The patient has                            taken no anticoagulant or antiplatelet agents. ASA                            Grade Assessment: I - A normal, healthy patient.                            After reviewing the risks and benefits, the patient                            was deemed in satisfactory condition to undergo the                            procedure.  After obtaining informed consent, the colonoscope                            was passed under direct vision. Throughout the                            procedure, the patient's blood pressure, pulse, and                            oxygen saturations were monitored continuously. The                            Olympus Scope SN: [id number] was introduced through                            the anus and advanced to the the terminal ileum,                             with identification of the appendiceal orifice and                            IC valve. The colonoscopy was performed without                            difficulty. The patient tolerated the procedure                            well. The quality of the bowel preparation was                            excellent. The terminal ileum, ileocecal valve,                            appendiceal orifice, and rectum were photographed. Scope In: 10:05:22 AM Scope Out: 10:16:49 AM Scope Withdrawal Time: 0 hours 7 minutes 30 seconds  Total Procedure Duration: 0 hours 11 minutes 27 seconds  Findings:                 Skin tags were found on perianal exam.                           The digital rectal exam was normal. Pertinent                            negatives include normal sphincter tone and no                            palpable rectal lesions.                           A few small-mouthed diverticula were found in the                            sigmoid colon.  The exam was otherwise normal throughout the                            examined colon.                           The terminal ileum appeared normal.                           Non-bleeding internal hemorrhoids were found during                            retroflexion. The hemorrhoids were medium-sized and                            Grade I (internal hemorrhoids that do not                            prolapse). Scarring from previous hemorrhoid                            banding was noted.                           No additional abnormalities were found on                            retroflexion. Complications:            No immediate complications. Estimated Blood Loss:     Estimated blood loss: none. Impression:               - Perianal skin tags found on perianal exam.                           - Mild diverticulosis in the sigmoid colon.                           - The examined portion of the  ileum was normal.                           - Non-bleeding internal hemorrhoids. This is the                            source of the patient's hematochezia.                           - No specimens collected. Recommendation:           - Patient has a contact number available for                            emergencies. The signs and symptoms of potential                            delayed complications were discussed with the  patient. Return to normal activities tomorrow.                            Written discharge instructions were provided to the                            patient.                           - Resume previous diet.                           - Repeat colonoscopy in 5-10 years for screening                            purposes. Given patient's multiple colonoscopies                            since 2010 with no polyps, would recommend next                            colonoscopy in 7 years.                           - Recommend high fiber diet/daily fiber supplement                            to reduce hemorrhoidal bleeding.                           - Consider pelvic floor physical therapy or                            anorectal manometry to evaluate for pelvic floor                            dyssynergia. Mylo Driskill E. Stacia, MD 12/01/2023 10:29:39 AM This report has been signed electronically.

## 2023-12-01 NOTE — Patient Instructions (Addendum)

## 2023-12-01 NOTE — Progress Notes (Signed)
 Pt's states no medical or surgical changes since previsit or office visit.

## 2023-12-01 NOTE — Progress Notes (Signed)
 Report to PACU, RN, vss, BBS= Clear.

## 2023-12-01 NOTE — Progress Notes (Signed)
 History and Physical Interval Note:  12/01/2023 9:53 AM  Kristi Coleman CHRISTELLA Phalen  has presented today for endoscopic procedure(s), with the diagnosis of  Encounter Diagnoses  Name Primary?   Rectal bleeding Yes   Family history of colon cancer   .  The various methods of evaluation and treatment have been discussed with the patient and/or family. After consideration of risks, benefits and other options for treatment, the patient has consented to  the endoscopic procedure(s).   The patient's history has been reviewed, patient examined, no change in status, stable for endoscopic procedure(s).  I have reviewed the patient's chart and labs.  Questions were answered to the patient's satisfaction.     Luisfernando Brightwell E. Stacia, MD [facility]

## 2023-12-02 ENCOUNTER — Telehealth: Payer: Self-pay | Admitting: *Deleted

## 2023-12-02 NOTE — Telephone Encounter (Signed)
  Follow up Call-     12/01/2023    9:09 AM  Call back number  Post procedure Call Back phone  # [phone]  Permission to leave phone message Yes     Patient questions:  Do you have a fever, pain , or abdominal swelling? No. Pain Score  0 *  Have you tolerated food without any problems? Yes.    Have you been able to return to your normal activities? Yes.    Do you have any questions about your discharge instructions: Diet   No. Medications  No. Follow up visit  No.  Do you have questions or concerns about your Care? No.  Actions: * If pain score is 4 or above: No action needed, pain <4.

## 2023-12-21 NOTE — Progress Notes (Signed)
 Agree with the assessment and plan as outlined by Ellouise Console, PA-C.  Although current ACG guidelines would recommend a 10 year interval colonoscopy in a patient with a FDR diagnosed with CRC after age 68 with a normal index colonoscopy, not every 5 years.

## 2024-01-25 DIAGNOSIS — K644 Residual hemorrhoidal skin tags: Secondary | ICD-10-CM | POA: Diagnosis not present

## 2024-01-25 DIAGNOSIS — K648 Other hemorrhoids: Secondary | ICD-10-CM | POA: Diagnosis not present

## 2024-03-28 ENCOUNTER — Other Ambulatory Visit: Payer: Self-pay | Admitting: Internal Medicine

## 2024-03-28 MED ORDER — CYCLOBENZAPRINE HCL 10 MG PO TABS: 10.0000 mg | ORAL_TABLET | Freq: Three times a day (TID) | ORAL | 0 refills | Status: AC | PRN

## 2024-03-30 ENCOUNTER — Other Ambulatory Visit: Payer: Self-pay | Admitting: Internal Medicine

## 2024-03-30 ENCOUNTER — Telehealth: Payer: Self-pay | Admitting: Internal Medicine

## 2024-03-30 DIAGNOSIS — M545 Low back pain, unspecified: Secondary | ICD-10-CM

## 2024-03-30 MED ORDER — ONDANSETRON 4 MG PO TBDP: 4.0000 mg | ORAL_TABLET | Freq: Three times a day (TID) | ORAL | 0 refills | Status: AC | PRN

## 2024-03-30 MED ORDER — HYDROCODONE-ACETAMINOPHEN 10-325 MG PO TABS: 1.0000 | ORAL_TABLET | ORAL | 0 refills | Status: DC | PRN

## 2024-03-30 MED ORDER — HYDROCODONE-ACETAMINOPHEN 10-325 MG PO TABS: 1.0000 | ORAL_TABLET | ORAL | 0 refills | Status: AC | PRN

## 2024-03-30 MED ORDER — PREDNISONE 10 MG PO TABS: ORAL_TABLET | ORAL | 0 refills | Status: DC

## 2024-03-30 NOTE — Telephone Encounter (Signed)
 Patient well known to me,  called in reprot severe back pain after bending down to pull beets from her garden..  pain still severe after 48 hours to tylenol , meloxicam  and flexeril .  Sending hydrocodone  and prednisone  to pharmacy

## 2024-04-01 ENCOUNTER — Other Ambulatory Visit: Payer: Self-pay | Admitting: Internal Medicine

## 2024-04-01 DIAGNOSIS — M5441 Lumbago with sciatica, right side: Secondary | ICD-10-CM

## 2024-04-02 ENCOUNTER — Other Ambulatory Visit: Payer: Self-pay | Admitting: Internal Medicine

## 2024-04-02 ENCOUNTER — Telehealth: Payer: Self-pay | Admitting: Internal Medicine

## 2024-04-02 DIAGNOSIS — M5431 Sciatica, right side: Secondary | ICD-10-CM

## 2024-04-02 MED ORDER — PREDNISONE 10 MG PO TABS: ORAL_TABLET | ORAL | 0 refills | Status: DC

## 2024-04-02 MED ORDER — GABAPENTIN 100 MG PO CAPS: 100.0000 mg | ORAL_CAPSULE | Freq: Three times a day (TID) | ORAL | 3 refills | Status: AC

## 2024-04-03 ENCOUNTER — Other Ambulatory Visit: Payer: Self-pay | Admitting: Internal Medicine

## 2024-04-03 ENCOUNTER — Ambulatory Visit
Admission: RE | Admit: 2024-04-03 | Discharge: 2024-04-03 | Disposition: A | Discharge status: Home / Self Care | Attending: Internal Medicine | Admitting: Internal Medicine

## 2024-04-03 ENCOUNTER — Ambulatory Visit
Admission: RE | Admit: 2024-04-03 | Discharge: 2024-04-03 | Disposition: A | Discharge status: Home / Self Care | Source: Ambulatory Visit | Attending: Internal Medicine | Admitting: Internal Medicine

## 2024-04-03 DIAGNOSIS — M545 Low back pain, unspecified: Secondary | ICD-10-CM

## 2024-04-03 DIAGNOSIS — M4316 Spondylolisthesis, lumbar region: Secondary | ICD-10-CM | POA: Diagnosis not present

## 2024-04-03 DIAGNOSIS — M4807 Spinal stenosis, lumbosacral region: Secondary | ICD-10-CM | POA: Diagnosis not present

## 2024-04-03 DIAGNOSIS — M47816 Spondylosis without myelopathy or radiculopathy, lumbar region: Secondary | ICD-10-CM | POA: Diagnosis not present

## 2024-04-04 ENCOUNTER — Encounter: Payer: Self-pay | Admitting: Internal Medicine

## 2024-04-06 ENCOUNTER — Other Ambulatory Visit: Payer: Self-pay | Admitting: Internal Medicine

## 2024-04-06 MED ORDER — TRAMADOL HCL 50 MG PO TABS: 100.0000 mg | ORAL_TABLET | Freq: Four times a day (QID) | ORAL | 0 refills | Status: AC | PRN

## 2024-04-06 MED ORDER — PREDNISONE 10 MG PO TABS: ORAL_TABLET | ORAL | 0 refills | Status: DC

## 2024-04-07 NOTE — Telephone Encounter (Signed)
 Patient's  acute lumbar spine pain needs prolonged steroid taper

## 2024-04-10 ENCOUNTER — Other Ambulatory Visit

## 2024-04-10 ENCOUNTER — Telehealth: Payer: Self-pay

## 2024-04-10 DIAGNOSIS — M5416 Radiculopathy, lumbar region: Secondary | ICD-10-CM | POA: Diagnosis not present

## 2024-04-10 NOTE — Telephone Encounter (Signed)
 Spoke with pt to let her know that the MRI has been canceled.

## 2024-04-10 NOTE — Addendum Note (Signed)
 Addended by: MARYLYNN VERNEITA CROME on: 04/10/2024 12:53 PM   Modules accepted: Orders

## 2024-04-10 NOTE — Telephone Encounter (Signed)
 Copied from CRM #[id number]. Topic: General - Other >> Apr 10, 2024 12:10 PM Kristi Coleman wrote: Reason for CRM: Patient called in stated she was at [facility] and needs the order canceled so she can be scheduled

## 2024-04-11 ENCOUNTER — Telehealth: Payer: Self-pay | Admitting: Internal Medicine

## 2024-04-11 NOTE — Telephone Encounter (Unsigned)
 Copied from CRM [id number]. Topic: Clinical - Medication Refill >> Apr 11, 2024  8:22 AM Carlyon D wrote: Medication: predniSONE  (DELTASONE ) 10 MG tablet  Has the patient contacted their pharmacy? No (Agent: If no, request that the patient contact the pharmacy for the refill. If patient does not wish to contact the pharmacy document the reason why and proceed with request.) (Agent: If yes, when and what did the pharmacy advise?)  This is the patient's preferred pharmacy:  [facility] - [place], [place] - [address] [place] [address] Phone: [phone] Fax: [phone]   Is this the correct pharmacy for this prescription? Yes If no, delete pharmacy and type the correct one.   Has the prescription been filled recently? No  Is the patient out of the medication? Yes  Has the patient been seen for an appointment in the last year OR does the patient have an upcoming appointment? No  Can we respond through MyChart? No, phone call   Agent: Please be advised that Rx refills may take up to 3 business days. We ask that you follow-up with your pharmacy.

## 2024-04-12 ENCOUNTER — Other Ambulatory Visit: Payer: Self-pay | Admitting: Internal Medicine

## 2024-04-12 MED ORDER — PREDNISONE 10 MG PO TABS: ORAL_TABLET | ORAL | 0 refills | Status: AC

## 2024-04-13 DIAGNOSIS — M5416 Radiculopathy, lumbar region: Secondary | ICD-10-CM | POA: Diagnosis not present

## 2024-04-27 DIAGNOSIS — M5416 Radiculopathy, lumbar region: Secondary | ICD-10-CM | POA: Diagnosis not present
